# Patient Record
Sex: Male | Born: 1975 | Race: White | Hispanic: No | Marital: Married | Smoking: Never smoker
Health system: Southern US, Community
[De-identification: ages and names within clinical notes are randomized; demographics above are authoritative.]

## PROBLEM LIST (undated history)

## (undated) DIAGNOSIS — T7840XA Allergy, unspecified, initial encounter: Secondary | ICD-10-CM

## (undated) DIAGNOSIS — M1990 Unspecified osteoarthritis, unspecified site: Secondary | ICD-10-CM

## (undated) DIAGNOSIS — E7800 Pure hypercholesterolemia, unspecified: Secondary | ICD-10-CM

## (undated) HISTORY — DX: Unspecified osteoarthritis, unspecified site: M19.90

## (undated) HISTORY — PX: FRACTURE SURGERY: SHX138

## (undated) HISTORY — PX: ARTHROSCOPIC REPAIR ACL: SUR80

## (undated) HISTORY — PX: SHOULDER ARTHROSCOPY W/ LABRAL REPAIR: SHX2399

## (undated) HISTORY — DX: Allergy, unspecified, initial encounter: T78.40XA

## (undated) HISTORY — DX: Pure hypercholesterolemia, unspecified: E78.00

---

## 2016-07-12 DIAGNOSIS — R030 Elevated blood-pressure reading, without diagnosis of hypertension: Secondary | ICD-10-CM | POA: Diagnosis not present

## 2016-07-12 DIAGNOSIS — R109 Unspecified abdominal pain: Secondary | ICD-10-CM | POA: Diagnosis not present

## 2016-07-12 DIAGNOSIS — K861 Other chronic pancreatitis: Secondary | ICD-10-CM | POA: Diagnosis not present

## 2016-07-30 DIAGNOSIS — Z0000 Encounter for general adult medical examination without abnormal findings: Secondary | ICD-10-CM | POA: Diagnosis not present

## 2016-07-30 DIAGNOSIS — Z114 Encounter for screening for human immunodeficiency virus [HIV]: Secondary | ICD-10-CM | POA: Diagnosis not present

## 2016-07-30 DIAGNOSIS — Z1389 Encounter for screening for other disorder: Secondary | ICD-10-CM | POA: Diagnosis not present

## 2016-07-30 DIAGNOSIS — R079 Chest pain, unspecified: Secondary | ICD-10-CM | POA: Diagnosis not present

## 2017-11-22 ENCOUNTER — Ambulatory Visit: Payer: Self-pay | Admitting: Osteopathic Medicine

## 2017-12-13 ENCOUNTER — Encounter: Payer: Self-pay | Admitting: Osteopathic Medicine

## 2017-12-13 ENCOUNTER — Ambulatory Visit (INDEPENDENT_AMBULATORY_CARE_PROVIDER_SITE_OTHER): Payer: BLUE CROSS/BLUE SHIELD | Admitting: Osteopathic Medicine

## 2017-12-13 VITALS — BP 135/91 | HR 87 | Temp 98.1°F | Ht 73.0 in | Wt 191.9 lb

## 2017-12-13 DIAGNOSIS — Z0000 Encounter for general adult medical examination without abnormal findings: Secondary | ICD-10-CM | POA: Diagnosis not present

## 2017-12-13 DIAGNOSIS — M25561 Pain in right knee: Secondary | ICD-10-CM

## 2017-12-13 DIAGNOSIS — M545 Low back pain, unspecified: Secondary | ICD-10-CM

## 2017-12-13 DIAGNOSIS — K589 Irritable bowel syndrome without diarrhea: Secondary | ICD-10-CM | POA: Diagnosis not present

## 2017-12-13 DIAGNOSIS — Z23 Encounter for immunization: Secondary | ICD-10-CM | POA: Diagnosis not present

## 2017-12-13 DIAGNOSIS — G8929 Other chronic pain: Secondary | ICD-10-CM | POA: Insufficient documentation

## 2017-12-13 DIAGNOSIS — M25562 Pain in left knee: Secondary | ICD-10-CM

## 2017-12-13 NOTE — Patient Instructions (Addendum)
General Preventive Care  Most recent routine screening lipids/other labs: ordered today. Cholesterol and Diabetes screening usually recommended annually.   Everyone should have blood pressure checked once per year. Yours was borderline today - goal 130 top, 80 bottom.   Tobacco: don't! Alcohol: responsible moderation is ok for most adults - if you have concerns about your alcohol intake, please talk to me! Recreational/Illicit Drugs: don't!  Exercise: as tolerated to reduce risk of cardiovascular disease and diabetes. Strength training will also prevent osteoporosis.   Mental health: if need for mental health care (medicines, counseling, other), or concerns about moods, please let me know!   Sexual health: if need for STD testing, or if concerns with libido/pain problems, please let me know! If you need to discuss your birth control options, please let me know!  Vaccines  Flu vaccine: recommended for almost everyone, every fall (by Halloween! Flu is scary!)  Shingles vaccine: Shingrix recommended after age 94   Pneumonia vaccines: Prevnar and Pneumovax recommended after age 36  Tetanus booster: Tdap recommended every 10 years Cancer screenings   Colon cancer screening: recommended for everyone at age 68, but some folks need a colonoscopy sooner if risk factors   Prostate cancer screening: recommendations vary, optional PSA blood test for men around age 41 Infection screenings . HIV: recommended screening at least once age 62-65, more often if needed . Gonorrhea/Chlamydia: screening as needed . Hepatitis C: recommended for anyone born 35-1965 . TB: certain at-risk populations, or depending on work requirements and/or travel history Other . Bone Density Test: recommended for men at age 18, sooner depending on risk factors . Advanced Directive: Living Will and/or Healthcare Power of Attorney recommended for all adults, regardless of age or health!      For GI issues: Can try  Immodium OTC 30-60 minutes prior to large meals  See below for FODMAP - foods to try avoiding   2017 UpToDate Characteristics and sources of common FODMAPs  Word that corresponds to letter in acronym Compounds in this category Foods that contain these compounds  F Fermentable  O Oligosaccharides Fructans, galacto-oligosaccharides Wheat, barley, rye, onion, leek, white part of spring onion, garlic, shallots, artichokes, beetroot, fennel, peas, chicory, pistachio, cashews, legumes, lentils, and chickpeas  D Disaccharides Lactose Milk, custard, ice cream, and yogurt  M Monosaccharides "Free fructose" (fructose in excess of glucose) Apples, pears, mangoes, cherries, watermelon, asparagus, sugar snap peas, honey, high-fructose corn syrup  A And  P Polyols Sorbitol, mannitol, maltitol, and xylitol Apples, pears, apricots, cherries, nectarines, peaches, plums, watermelon, mushrooms, cauliflower, artificially sweetened chewing gum and confectionery  FODMAPs: fermentable oligosaccharides, disaccharides, monosaccharides, and polyols. Adapted by permission from [employer]: [employer]. Lonell Face, [employer]omer [employer], [place]. Short-chain carbohydrates and functional gastrointestinal disorders. Am J Gastroenterol 2013; 108:707. Copyright  2013. www.nature.com/ajg. Graphic 16109 Version 2.0    Please note: Preventive care issues were addressed today as part of your annual wellness physical, and this care should be covered under your insurance. However there were other medical issues which were also addressed today, and insurance may bill you separately for a "problem-based visit" for this care for GI and musculoskeletal problems. Any questions or concerns about charges which may appear on your billing statement should be directed to your insurance company or to [facility] billing department, and they will contact our office if there are further concerns.

## 2017-12-13 NOTE — Progress Notes (Signed)
HPI: Johnathan Oneal is a 42 y.o. male who  has no past medical history on file.  he presents to [facility] today, 12/13/17,  for chief complaint of: New to establish care Request routine yearly checkup Knee pain IBS symptoms  History of high cholesterol, previously on Lipitor, no medications now.  Never smoker, never recreational drugs, rare alcohol use.  Cisgender, heterosexual.  Last time tested for STI was 2017.  Declines STI screening now.  One partner within the past year.  They are using birth control pill for contraception.  Family history of breast cancer in mother.  No history of prostate cancer or colon cancer.  Knee pain bilateral, worse in L knee. Ongoing, previous following w/ ortho, few injections, arthroscopies, previously torn ACL. Knees still bother hi, R quadricep has been sore as well, no recent injury. He's working on low impact exercises.   Digestive concerns: Reports urgency to have a bowel movement after a large meal, chocolate and tomato and he has done a bit better since cutting these things out.  Occasional epigastric pain, no nausea, no significant GERD.  No bloody or dark stool, bowel movements limited to usually once per day.   Available records reviewed, [facility] visit 03/19/2015 for hyperlipidemia, bilateral knee arthritis, chronic back pain.  Lipitor 40 mg was prescribed.  He is knee injections, minimally helpful.  Has been seeing a chiropractor, taking glucosamine supplement.  Medications included Allegra 180 mg daily, Singulair 10 mg daily    Past medical, surgical, social and family history reviewed:  Patient Active Problem List   Diagnosis Date Noted  . Symptoms consistent with irritable bowel syndrome 12/13/2017  . Chronic pain of both knees 12/13/2017  . Chronic bilateral low back pain without sciatica 12/13/2017    Past Surgical History:  Procedure Laterality Date  . ARTHROSCOPIC REPAIR ACL     Bilateral Patella Area  . SHOULDER ARTHROSCOPY W/ LABRAL REPAIR      Social History   Tobacco Use  . Smoking status: Not on file  Substance Use Topics  . Alcohol use: Not on file    Family History  Problem Relation Age of Onset  . Breast cancer Mother   . High blood pressure Father   . Diabetes Father   . Heart attack Maternal Grandfather   . Canavan disease Paternal Grandmother   . Cancer Paternal Grandmother   . Diabetes Paternal Grandfather      Current medication list and allergy/intolerance information reviewed:    No current outpatient medications on file.   No current facility-administered medications for this visit.     Allergies  Allergen Reactions  . Penicillins     As per pt - had a reaction as a child. Does not recall what type of reaction to med.       Review of Systems:  Constitutional:  No  fever, no chills, No recent illness, No unintentional weight changes. No significant fatigue.   HEENT: No  headache, no vision change, no hearing change, No sore throat, No  sinus pressure  Cardiac: No  chest pain, No  pressure, No palpitations, No  Orthopnea  Respiratory:  No  shortness of breath. No  Cough  Gastrointestinal: No  abdominal pain, No  nausea, No  vomiting,  No  blood in stool, No  diarrhea, No  constipation   Musculoskeletal: No new myalgia/arthralgia  Skin: No  Rash, No other wounds/concerning lesions  Genitourinary: No  incontinence, No  abnormal genital  bleeding, No abnormal genital discharge  Hem/Onc: No  easy bruising/bleeding, No  abnormal lymph node  Endocrine: No cold intolerance,  No heat intolerance. No polyuria/polydipsia/polyphagia   Neurologic: No  weakness, No  dizziness, No  slurred speech/focal weakness/facial droop  Psychiatric: No  concerns with depression, No  concerns with anxiety, No sleep problems, No mood problems  Exam:  BP (!) 135/91 (BP Location: Left Arm, Patient Position: Sitting, Cuff Size: Normal)   Pulse  87   Temp 98.1 F (36.7 C) (Oral)   Ht 6\' 1"  (1.854 m)   Wt 191 lb 14.4 oz (87 kg)   BMI 25.32 kg/m   Constitutional: VS see above. General Appearance: alert, well-developed, well-nourished, NAD  Eyes: Normal lids and conjunctive, non-icteric sclera  Ears, Nose, Mouth, Throat: MMM, Normal external inspection ears/nares/mouth/lips/gums. TM normal bilaterally. Pharynx/tonsils no erythema, no exudate. Nasal mucosa normal.   Neck: No masses, trachea midline. No thyroid enlargement. No tenderness/mass appreciated. No lymphadenopathy  Respiratory: Normal respiratory effort. no wheeze, no rhonchi, no rales  Cardiovascular: S1/S2 normal, no murmur, no rub/gallop auscultated. RRR. No lower extremity edema. Pedal pulse II/IV bilaterally DP and PT. No carotid bruit or JVD. No abdominal aortic bruit.  Gastrointestinal: Nontender, no masses. No hepatomegaly, no splenomegaly. No hernia appreciated. Bowel sounds normal. Rectal exam deferred.   Musculoskeletal: Gait normal. No clubbing/cyanosis of digits. (+)crepitus L knee, ligmanets stable bilaterally ACL/PCL/MCL/LCL. Normal patellar motion    Neurological: Normal balance/coordination. No tremor. No cranial nerve deficit on limited exam. Motor and sensation intact and symmetric. Cerebellar reflexes intact.   Skin: warm, dry, intact. No rash/ulcer. No concerning nevi or subq nodules on limited exam.    Psychiatric: Normal judgment/insight. Normal mood and affect. Oriented x3.    No results found for this or any previous visit (from the past 72 hour(s)).  No results found.   ASSESSMENT/PLAN:   Annual physical exam  Symptoms consistent with irritable bowel syndrome - Plan: CBC, COMPLETE METABOLIC PANEL WITH GFR, Lipid panel, TSH, Lipase  Need for influenza vaccination - Plan: Flu Vaccine QUAD 6+ mos PF IM (Fluarix Quad PF)  Chronic pain of both knees - Plan: CBC, COMPLETE METABOLIC PANEL WITH GFR, Lipid panel, TSH, Lipase  Chronic  bilateral low back pain without sciatica - Plan: CBC, COMPLETE METABOLIC PANEL WITH GFR, Lipid panel, TSH, Lipase    Patient Instructions   General Preventive Care  Most recent routine screening lipids/other labs: ordered today. Cholesterol and Diabetes screening usually recommended annually.   Everyone should have blood pressure checked once per year. Yours was borderline today - goal 130 top, 80 bottom.   Tobacco: don't! Alcohol: responsible moderation is ok for most adults - if you have concerns about your alcohol intake, please talk to me! Recreational/Illicit Drugs: don't!  Exercise: as tolerated to reduce risk of cardiovascular disease and diabetes. Strength training will also prevent osteoporosis.   Mental health: if need for mental health care (medicines, counseling, other), or concerns about moods, please let me know!   Sexual health: if need for STD testing, or if concerns with libido/pain problems, please let me know! If you need to discuss your birth control options, please let me know!  Vaccines  Flu vaccine: recommended for almost everyone, every fall (by Halloween! Flu is scary!)  Shingles vaccine: Shingrix recommended after age 38   Pneumonia vaccines: Prevnar and Pneumovax recommended after age 78  Tetanus booster: Tdap recommended every 10 years Cancer screenings   Colon cancer screening: recommended for everyone  at age 81, but some folks need a colonoscopy sooner if risk factors   Prostate cancer screening: recommendations vary, optional PSA blood test for men around age 7 Infection screenings . HIV: recommended screening at least once age 25-65, more often if needed . Gonorrhea/Chlamydia: screening as needed . Hepatitis C: recommended for anyone born 44-1964 . TB: certain at-risk populations, or depending on work requirements and/or travel history Other . Bone Density Test: recommended for men at age 39, sooner depending on risk factors . Advanced  Directive: Living Will and/or Healthcare Power of Attorney recommended for all adults, regardless of age or health!      For GI issues: Can try Immodium OTC 30-60 minutes prior to large meals  See below for FODMAP - foods to try avoiding   2017 UpToDate Characteristics and sources of common FODMAPs  Word that corresponds to letter in acronym Compounds in this category Foods that contain these compounds  F Fermentable  O Oligosaccharides Fructans, galacto-oligosaccharides Wheat, barley, rye, onion, leek, white part of spring onion, garlic, shallots, artichokes, beetroot, fennel, peas, chicory, pistachio, cashews, legumes, lentils, and chickpeas  D Disaccharides Lactose Milk, custard, ice cream, and yogurt  M Monosaccharides "Free fructose" (fructose in excess of glucose) Apples, pears, mangoes, cherries, watermelon, asparagus, sugar snap peas, honey, high-fructose corn syrup  A And  P Polyols Sorbitol, mannitol, maltitol, and xylitol Apples, pears, apricots, cherries, nectarines, peaches, plums, watermelon, mushrooms, cauliflower, artificially sweetened chewing gum and confectionery  FODMAPs: fermentable oligosaccharides, disaccharides, monosaccharides, and polyols. Adapted by permission from [employer]: [employer]. Lonell Face, [employer], [place]. Short-chain carbohydrates and functional gastrointestinal disorders. Am J Gastroenterol 2013; 108:707. Copyright  2013. www.nature.com/ajg. Graphic 16109 Version 2.0             Visit summary with medication list and pertinent instructions was printed for patient to review. All questions at time of visit were answered - patient instructed to contact office with any additional concerns. ER/RTC precautions were reviewed with the patient.   Follow-up plan: Return in about 1 year (around 12/14/2018) for annual wellness check-up.   Please note: voice recognition software was used to produce this  document, and typos may escape review. Please contact Dr. Lyn Hollingshead for any needed clarifications.

## 2017-12-14 ENCOUNTER — Encounter: Payer: Self-pay | Admitting: Osteopathic Medicine

## 2017-12-14 DIAGNOSIS — E782 Mixed hyperlipidemia: Secondary | ICD-10-CM

## 2017-12-14 LAB — CBC
HCT: 42.7 % (ref 38.5–50.0)
Hemoglobin: 14.5 g/dL (ref 13.2–17.1)
MCH: 29.8 pg (ref 27.0–33.0)
MCHC: 34 g/dL (ref 32.0–36.0)
MCV: 87.7 fL (ref 80.0–100.0)
MPV: 12.3 fL (ref 7.5–12.5)
PLATELETS: 254 10*3/uL (ref 140–400)
RBC: 4.87 10*6/uL (ref 4.20–5.80)
RDW: 12.6 % (ref 11.0–15.0)
WBC: 7.9 10*3/uL (ref 3.8–10.8)

## 2017-12-14 LAB — COMPLETE METABOLIC PANEL WITH GFR
AG RATIO: 1.7 (calc) (ref 1.0–2.5)
ALT: 26 U/L (ref 9–46)
AST: 21 U/L (ref 10–40)
Albumin: 4.5 g/dL (ref 3.6–5.1)
Alkaline phosphatase (APISO): 90 U/L (ref 40–115)
BUN: 14 mg/dL (ref 7–25)
CO2: 27 mmol/L (ref 20–32)
Calcium: 9.4 mg/dL (ref 8.6–10.3)
Chloride: 105 mmol/L (ref 98–110)
Creat: 0.85 mg/dL (ref 0.60–1.35)
GFR, Est African American: 126 mL/min/{1.73_m2} (ref >=60)
GFR, Est Non African American: 109 mL/min/{1.73_m2} (ref >=60)
GLUCOSE: 83 mg/dL (ref 65–99)
Globulin: 2.6 g/dL (calc) (ref 1.9–3.7)
POTASSIUM: 4.2 mmol/L (ref 3.5–5.3)
Sodium: 140 mmol/L (ref 135–146)
Total Bilirubin: 0.4 mg/dL (ref 0.2–1.2)
Total Protein: 7.1 g/dL (ref 6.1–8.1)

## 2017-12-14 LAB — LIPID PANEL
Cholesterol: 250 mg/dL — ABNORMAL HIGH (ref <200)
HDL: 30 mg/dL — AB (ref >40)
LDL Cholesterol (Calc): 172 mg/dL (calc) — ABNORMAL HIGH
Non-HDL Cholesterol (Calc): 220 mg/dL (calc) — ABNORMAL HIGH (ref <130)
TRIGLYCERIDES: 268 mg/dL — AB (ref <150)
Total CHOL/HDL Ratio: 8.3 (calc) — ABNORMAL HIGH (ref <5.0)

## 2017-12-14 LAB — LIPASE: Lipase: 5 U/L — ABNORMAL LOW (ref 7–60)

## 2017-12-14 LAB — TSH: TSH: 0.93 mIU/L (ref 0.40–4.50)

## 2017-12-15 MED ORDER — ATORVASTATIN CALCIUM 20 MG PO TABS: 20.0000 mg | ORAL_TABLET | Freq: Every day | ORAL | 3 refills | Status: DC

## 2018-03-01 LAB — HEPATIC FUNCTION PANEL
AG Ratio: 1.7 (calc) (ref 1.0–2.5)
ALBUMIN MSPROF: 4.6 g/dL (ref 3.6–5.1)
ALT: 98 U/L — ABNORMAL HIGH (ref 9–46)
AST: 31 U/L (ref 10–40)
Alkaline phosphatase (APISO): 96 U/L (ref 40–115)
BILIRUBIN DIRECT: 0.1 mg/dL (ref 0.0–0.2)
BILIRUBIN TOTAL: 0.6 mg/dL (ref 0.2–1.2)
GLOBULIN: 2.7 g/dL (ref 1.9–3.7)
Indirect Bilirubin: 0.5 mg/dL (calc) (ref 0.2–1.2)
Total Protein: 7.3 g/dL (ref 6.1–8.1)

## 2018-03-01 LAB — LIPID PANEL
CHOLESTEROL: 172 mg/dL (ref <200)
HDL: 35 mg/dL — AB (ref >40)
LDL Cholesterol (Calc): 114 mg/dL (calc) — ABNORMAL HIGH
NON-HDL CHOLESTEROL (CALC): 137 mg/dL — AB (ref <130)
Total CHOL/HDL Ratio: 4.9 (calc) (ref <5.0)
Triglycerides: 115 mg/dL (ref <150)

## 2018-03-07 ENCOUNTER — Other Ambulatory Visit: Payer: Self-pay | Admitting: Osteopathic Medicine

## 2018-03-07 DIAGNOSIS — R748 Abnormal levels of other serum enzymes: Secondary | ICD-10-CM

## 2018-03-18 LAB — HEPATIC FUNCTION PANEL
AG Ratio: 1.6 (calc) (ref 1.0–2.5)
ALBUMIN MSPROF: 4.4 g/dL (ref 3.6–5.1)
ALT: 37 U/L (ref 9–46)
AST: 20 U/L (ref 10–40)
Alkaline phosphatase (APISO): 86 U/L (ref 40–115)
Bilirubin, Direct: 0.1 mg/dL (ref 0.0–0.2)
Globulin: 2.8 g/dL (calc) (ref 1.9–3.7)
Indirect Bilirubin: 0.4 mg/dL (calc) (ref 0.2–1.2)
Total Bilirubin: 0.5 mg/dL (ref 0.2–1.2)
Total Protein: 7.2 g/dL (ref 6.1–8.1)

## 2018-03-20 ENCOUNTER — Encounter: Payer: Self-pay | Admitting: Osteopathic Medicine

## 2018-03-20 DIAGNOSIS — E782 Mixed hyperlipidemia: Secondary | ICD-10-CM

## 2018-03-20 MED ORDER — PRAVASTATIN SODIUM 20 MG PO TABS: 20.0000 mg | ORAL_TABLET | Freq: Every day | ORAL | 3 refills | Status: DC

## 2018-05-19 ENCOUNTER — Other Ambulatory Visit: Payer: Self-pay | Admitting: Osteopathic Medicine

## 2018-05-19 LAB — COMPLETE METABOLIC PANEL WITH GFR
AG Ratio: 1.6 (calc) (ref 1.0–2.5)
ALT: 28 U/L (ref 9–46)
AST: 18 U/L (ref 10–40)
Albumin: 4.5 g/dL (ref 3.6–5.1)
Alkaline phosphatase (APISO): 97 U/L (ref 36–130)
BUN: 16 mg/dL (ref 7–25)
CO2: 29 mmol/L (ref 20–32)
Calcium: 9.7 mg/dL (ref 8.6–10.3)
Chloride: 103 mmol/L (ref 98–110)
Creat: 0.94 mg/dL (ref 0.60–1.35)
GFR, Est African American: 116 mL/min/{1.73_m2} (ref >=60)
GFR, Est Non African American: 100 mL/min/{1.73_m2} (ref >=60)
Globulin: 2.8 g/dL (calc) (ref 1.9–3.7)
Glucose, Bld: 85 mg/dL (ref 65–99)
Potassium: 4.4 mmol/L (ref 3.5–5.3)
Sodium: 140 mmol/L (ref 135–146)
Total Bilirubin: 0.5 mg/dL (ref 0.2–1.2)
Total Protein: 7.3 g/dL (ref 6.1–8.1)

## 2018-05-19 LAB — LIPID PANEL W/REFLEX DIRECT LDL
Cholesterol: 213 mg/dL — ABNORMAL HIGH (ref <200)
HDL: 33 mg/dL — ABNORMAL LOW (ref >=40)
LDL CHOLESTEROL (CALC): 151 mg/dL — AB
Non-HDL Cholesterol (Calc): 180 mg/dL (calc) — ABNORMAL HIGH (ref <130)
Total CHOL/HDL Ratio: 6.5 (calc) — ABNORMAL HIGH (ref <5.0)
Triglycerides: 156 mg/dL — ABNORMAL HIGH (ref <150)

## 2018-06-01 ENCOUNTER — Encounter: Payer: Self-pay | Admitting: Osteopathic Medicine

## 2018-06-01 MED ORDER — PRAVASTATIN SODIUM 40 MG PO TABS: 40.0000 mg | ORAL_TABLET | Freq: Every day | ORAL | 3 refills | Status: DC

## 2018-12-14 ENCOUNTER — Other Ambulatory Visit: Payer: Self-pay

## 2018-12-14 ENCOUNTER — Encounter: Payer: Self-pay | Admitting: Osteopathic Medicine

## 2018-12-14 ENCOUNTER — Ambulatory Visit (INDEPENDENT_AMBULATORY_CARE_PROVIDER_SITE_OTHER): Payer: BC Managed Care – PPO | Admitting: Osteopathic Medicine

## 2018-12-14 VITALS — BP 138/83 | HR 67 | Temp 98.2°F | Wt 189.0 lb

## 2018-12-14 DIAGNOSIS — Z0000 Encounter for general adult medical examination without abnormal findings: Secondary | ICD-10-CM

## 2018-12-14 DIAGNOSIS — E782 Mixed hyperlipidemia: Secondary | ICD-10-CM

## 2018-12-14 NOTE — Progress Notes (Signed)
HPI: Johnathan Oneal is a 43 y.o. male who  has a past medical history of High cholesterol.  [facility], 12/14/18,  for chief complaint of: Annual Physical     Patient here for annual physical / wellness exam.  See preventive care reviewed as below.  Recent labs reviewed in detail with the patient. Due for follow-up lipids   Additional concerns today include: None other than occasional knee pain which responds well to OTC tx     Past medical, surgical, social and family history reviewed:  Patient Active Problem List   Diagnosis Date Noted  . Symptoms consistent with irritable bowel syndrome 12/13/2017  . Chronic pain of both knees 12/13/2017  . Chronic bilateral low back pain without sciatica 12/13/2017    Past Surgical History:  Procedure Laterality Date  . ARTHROSCOPIC REPAIR ACL     Bilateral Patella Area  . SHOULDER ARTHROSCOPY W/ LABRAL REPAIR      Social History   Tobacco Use  . Smoking status: Never Smoker  . Smokeless tobacco: Never Used  Substance Use Topics  . Alcohol use: Not Currently    Family History  Problem Relation Age of Onset  . Breast cancer Mother   . High blood pressure Father   . Diabetes Father   . Heart attack Maternal Grandfather   . Canavan disease Paternal Grandmother   . Cancer Paternal Grandmother   . Diabetes Paternal Grandfather      Current medication list and allergy/intolerance information reviewed:    Current Outpatient Medications  Medication Sig Dispense Refill  . pravastatin (PRAVACHOL) 40 MG tablet Take 1 tablet (40 mg total) by mouth daily. 90 tablet 3   No current facility-administered medications for this visit.     Allergies  Allergen Reactions  . Penicillins     As per pt - had a reaction as a child. Does not recall what type of reaction to med.       Review of Systems:  Constitutional:  No  fever, no chills, No recent illness  HEENT: No   headache, no vision change  Cardiac: No  chest pain, No  pressure  Respiratory:  No  shortness of breath. No  Cough  Gastrointestinal: No  abdominal pain, No  nausea, No  vomiting,  No  blood in stool, No  diarrhea, No  constipation   Musculoskeletal: No new myalgia/arthralgia  Skin: No  Rash, No other wounds/concerning lesions  Genitourinary: No  incontinence, No  abnormal genital bleeding, No abnormal genital discharge  Hem/Onc: No  easy bruising/bleeding  Endocrine: No cold intolerance,  No heat intolerance.   Neurologic: No  weakness, No  dizziness  Psychiatric: No  concerns with depression, No  concerns with anxiety, No sleep problems, No mood problems  Exam:  BP 138/83 (BP Location: Right Arm, Patient Position: Sitting, Cuff Size: Normal)   Pulse 67   Temp 98.2 F (36.8 C) (Oral)   Wt 189 lb 0.6 oz (85.7 kg)   BMI 24.94 kg/m   Constitutional: VS see above. General Appearance: alert, well-developed, well-nourished, NAD  Eyes: Normal lids and conjunctive, non-icteric sclera  Ears, Nose, Mouth, Throat:  TM normal bilaterally.   Neck: No masses, trachea midline. No thyroid enlargement. No tenderness/mass appreciated. No lymphadenopathy  Respiratory: Normal respiratory effort. no wheeze, no rhonchi, no rales  Cardiovascular: S1/S2 normal, no murmur, no rub/gallop auscultated. RRR. No lower extremity edema.   Gastrointestinal: Nontender, no masses. No hepatomegaly, no  splenomegaly. No hernia appreciated. Bowel sounds normal. Rectal exam deferred.   Musculoskeletal: Gait normal. No clubbing/cyanosis of digits.   Neurological: Normal balance/coordination. No tremor. No cranial nerve deficit on limited exam. Motor and sensation intact and symmetric. Cerebellar reflexes intact.   Skin: warm, dry, intact. No rash/ulcer. No concerning nevi or subq nodules on limited exam.    Psychiatric: Normal judgment/insight. Normal mood and affect. Oriented x3.    No results found  for this or any previous visit (from the past 72 hour(s)).  No results found.        ASSESSMENT/PLAN: The primary encounter diagnosis was Annual physical exam. A diagnosis of Mixed hyperlipidemia was also pertinent to this visit.  Patient will come off of cholesterol medications for 4 to 6 weeks and will follow up in office for nurse visit and will get labs done that day.  Looking okay/ASCVD risk okay, will leave off of antihyperlipidemics.   The 10-year ASCVD risk score Denman George DC Montez Hageman., et al., 2013) is: 2.9%   Values used to calculate the score:     Age: 42 years     Sex: Male     Is Non-Hispanic African American: No     Diabetic: No     Tobacco smoker: No     Systolic Blood Pressure: 138 mmHg     Is BP treated: No     HDL Cholesterol: 33 mg/dL     Total Cholesterol: 213 mg/dL   Orders Placed This Encounter  Procedures  . CBC  . COMPLETE METABOLIC PANEL WITH GFR  . Lipid panel    No orders of the defined types were placed in this encounter.   Patient Instructions  General Preventive Care  Most recent routine screening lipids/other labs: ordered today!   Everyone should have blood pressure checked once per year. Goal 130/80 or less.   Tobacco: don't!   Alcohol: responsible moderation is ok for most adults - if you have concerns about your alcohol intake, please talk to me!   Exercise: as tolerated to reduce risk of cardiovascular disease and diabetes. Strength training will also prevent osteoporosis.   Mental health: if need for mental health care (medicines, counseling, other), or concerns about moods, please let me know!   Sexual health: if need for STD testing, or if concerns with libido/pain problems, please let me know!   Advanced Directive: Living Will and/or Healthcare Power of Attorney recommended for all adults, regardless of age or health.  Vaccines  Flu vaccine: recommended for almost everyone, every fall.   Shingles vaccine: Shingrix recommended  after age 84.   Pneumonia vaccines: Prevnar and Pneumovax recommended after age 76, or sooner if certain medical conditions/smoker  Tetanus booster: Tdap recommended every 10 years. Due 2022 Cancer screenings   Colon cancer screening: recommended for everyone at age 67-75, sooner if needed if stor  Prostate cancer screening: PSA blood test around age 58, sooner if needed if family history of prostate cancer   Lung cancer screening: not needed for non-smokers  Infection screenings . HIV: recommended screening at least once age 23-65, more often as needed. . Gonorrhea/Chlamydia: screening as needed. . Hepatitis C: recommended for anyone born [birth year]-1965 . TB: certain at-risk populations, or depending on work requirements and/or travel history Other . Bone Density Test: recommended for men at age 74, sooner depending on risk factors . Abdominal Aortic Aneurysm: screening with ultrasound recommended once for men age 67-75 who have ever smoked  Visit summary with medication list and pertinent instructions was printed for patient to review. All questions at time of visit were answered - patient instructed to contact office with any additional concerns or updates. ER/RTC precautions were reviewed with the patient.   Please note: voice recognition software was used to produce this document, and typos may escape review. Please contact Dr. Sheppard Coil for any needed clarifications.     Follow-up plan: Return in about 1 year (around 12/14/2019) for Nurse visit for BP check, and lab visit, in 4-6 weeks AM fasting. Marland Kitchen

## 2018-12-14 NOTE — Patient Instructions (Addendum)
General Preventive Care  Most recent routine screening lipids/other labs: ordered today!   Everyone should have blood pressure checked once per year. Goal 130/80 or less.   Tobacco: don't!   Alcohol: responsible moderation is ok for most adults - if you have concerns about your alcohol intake, please talk to me!   Exercise: as tolerated to reduce risk of cardiovascular disease and diabetes. Strength training will also prevent osteoporosis.   Mental health: if need for mental health care (medicines, counseling, other), or concerns about moods, please let me know!   Sexual health: if need for STD testing, or if concerns with libido/pain problems, please let me know!   Advanced Directive: Living Will and/or Healthcare Power of Attorney recommended for all adults, regardless of age or health.  Vaccines  Flu vaccine: recommended for almost everyone, every fall.   Shingles vaccine: Shingrix recommended after age 84.   Pneumonia vaccines: Prevnar and Pneumovax recommended after age 67, or sooner if certain medical conditions/smoker  Tetanus booster: Tdap recommended every 10 years. Due 2022 Cancer screenings   Colon cancer screening: recommended for everyone at age 35-75, sooner if needed if stor  Prostate cancer screening: PSA blood test around age 60, sooner if needed if family history of prostate cancer   Lung cancer screening: not needed for non-smokers  Infection screenings . HIV: recommended screening at least once age 51-65, more often as needed. . Gonorrhea/Chlamydia: screening as needed. . Hepatitis C: recommended for anyone born 65-1965 . TB: certain at-risk populations, or depending on work requirements and/or travel history Other . Bone Density Test: recommended for men at age 86, sooner depending on risk factors . Abdominal Aortic Aneurysm: screening with ultrasound recommended once for men age 66-75 who have ever smoked

## 2018-12-14 NOTE — Progress Notes (Signed)
HPI: Johnathan Oneal is a 43 y.o. male who  has a past medical history of High cholesterol.  he presents to [facility] today, 12/14/18,  for chief complaint of: annual visit   Patient is doing well today. BP elevated to 138/83. Does not take BP at home. Already received flu shot this season. No other complaints.    Past medical, surgical, social and family history reviewed:  Patient Active Problem List   Diagnosis Date Noted  . Symptoms consistent with irritable bowel syndrome 12/13/2017  . Chronic pain of both knees 12/13/2017  . Chronic bilateral low back pain without sciatica 12/13/2017    Past Surgical History:  Procedure Laterality Date  . ARTHROSCOPIC REPAIR ACL     Bilateral Patella Area  . SHOULDER ARTHROSCOPY W/ LABRAL REPAIR      Social History   Tobacco Use  . Smoking status: Never Smoker  . Smokeless tobacco: Never Used  Substance Use Topics  . Alcohol use: Not Currently    Family History  Problem Relation Age of Onset  . Breast cancer Mother   . High blood pressure Father   . Diabetes Father   . Heart attack Maternal Grandfather   . Canavan disease Paternal Grandmother   . Cancer Paternal Grandmother   . Diabetes Paternal Grandfather      Current medication list and allergy/intolerance information reviewed:    Current Outpatient Medications  Medication Sig Dispense Refill  . pravastatin (PRAVACHOL) 40 MG tablet Take 1 tablet (40 mg total) by mouth daily. 90 tablet 3   No current facility-administered medications for this visit.     Allergies  Allergen Reactions  . Penicillins     As per pt - had a reaction as a child. Does not recall what type of reaction to med.       Review of Systems:  Constitutional:   No recent illness,. No significant fatigue.   HEENT: No  headache  Cardiac: No  chest pain  Respiratory:  No  shortness of breath. No  Cough  Gastrointestinal: No  abdominal pain, No   nausea  Musculoskeletal: No new myalgia/arthralgia   Exam:  BP 138/83 (BP Location: Right Arm, Patient Position: Sitting, Cuff Size: Normal)   Pulse 67   Temp 98.2 F (36.8 C) (Oral)   Wt 189 lb 0.6 oz (85.7 kg)   BMI 24.94 kg/m   Constitutional: VS see above. General Appearance: alert, well-developed, well-nourished, NAD  Eyes: Normal lids and conjunctive, non-icteric sclera  Ears: . TM normal bilaterally.   Neck: No masses, trachea midline. No thyroid enlargement. No tenderness/mass appreciated. No lymphadenopathy  Respiratory: Normal respiratory effort. no wheeze, no rhonchi, no rales  Cardiovascular: S1/S2 normal, no murmur, no rub/gallop auscultated. RRR. No lower extremity edema.  Gastrointestinal: Nontender, no masses. No hepatomegaly, no splenomegaly. No hernia appreciated. Bowel sounds normal. Rectal exam deferred.   Musculoskeletal: Gait normal. No clubbing/cyanosis of digits.   Neurological: Normal balance/coordination. No tremor. No cranial nerve deficit on limited exam.   Skin: warm, dry, intact. No rash/ulcer. No concerning nevi or subq nodules on limited exam.    Psychiatric: Normal judgment/insight. Normal mood and affect. Oriented x3.    No results found for this or any previous visit (from the past 72 hour(s)).  No results found.   ASSESSMENT/PLAN: The primary encounter diagnosis was Annual physical exam. A diagnosis of Mixed hyperlipidemia was also pertinent to this visit.   BP - Obtain BP cuff and monitor BP  regularly at home.   Hyperlipidemia - Trial 1 month off of pravastatin. Follow up in 4-6 weeks with fasting labs and BP check.    Orders Placed This Encounter  Procedures  . CBC  . COMPLETE METABOLIC PANEL WITH GFR  . Lipid panel    No orders of the defined types were placed in this encounter.   Patient Instructions  General Preventive Care  Most recent routine screening lipids/other labs: ordered today!   Everyone should have  blood pressure checked once per year. Goal 130/80 or less.   Tobacco: don't!   Alcohol: responsible moderation is ok for most adults - if you have concerns about your alcohol intake, please talk to me!   Exercise: as tolerated to reduce risk of cardiovascular disease and diabetes. Strength training will also prevent osteoporosis.   Mental health: if need for mental health care (medicines, counseling, other), or concerns about moods, please let me know!   Sexual health: if need for STD testing, or if concerns with libido/pain problems, please let me know!   Advanced Directive: Living Will and/or Healthcare Power of Attorney recommended for all adults, regardless of age or health.  Vaccines  Flu vaccine: recommended for almost everyone, every fall.   Shingles vaccine: Shingrix recommended after age 69.   Pneumonia vaccines: Prevnar and Pneumovax recommended after age 71, or sooner if certain medical conditions/smoker  Tetanus booster: Tdap recommended every 10 years. Due 2022 Cancer screenings   Colon cancer screening: recommended for everyone at age 76-75, sooner if needed if stor  Prostate cancer screening: PSA blood test around age 44, sooner if needed if family history of prostate cancer   Lung cancer screening: not needed for non-smokers  Infection screenings . HIV: recommended screening at least once age 67-65, more often as needed. . Gonorrhea/Chlamydia: screening as needed. . Hepatitis C: recommended for anyone born 47-1964 . TB: certain at-risk populations, or depending on work requirements and/or travel history Other . Bone Density Test: recommended for men at age 22, sooner depending on risk factors . Abdominal Aortic Aneurysm: screening with ultrasound recommended once for men age 25-75 who have ever smoked         Visit summary with medication list and pertinent instructions was printed for patient to review. All questions at time of visit were answered -  patient instructed to contact office with any additional concerns or updates. ER/RTC precautions were reviewed with the patient.    Please note: voice recognition software was used to produce this document, and typos may escape review. Please contact Dr. Sheppard Coil for any needed clarifications.     Follow-up plan: Return in about 1 year (around 12/14/2019) for [place] (call week prior to visit for lab orders).

## 2019-01-11 ENCOUNTER — Ambulatory Visit (INDEPENDENT_AMBULATORY_CARE_PROVIDER_SITE_OTHER): Payer: BC Managed Care – PPO | Admitting: Osteopathic Medicine

## 2019-01-11 ENCOUNTER — Other Ambulatory Visit: Payer: Self-pay

## 2019-01-11 VITALS — BP 119/82 | HR 64 | Temp 98.4°F | Ht 72.0 in | Wt 189.0 lb

## 2019-01-11 DIAGNOSIS — R030 Elevated blood-pressure reading, without diagnosis of hypertension: Secondary | ICD-10-CM

## 2019-01-11 NOTE — Progress Notes (Signed)
Patient in clinic for a BP check. Patient is not taking any medication at this time. Patient reports it was slightly elevated at the last visit. I advised him that I would let him know if a follow up was needed. He does not check his BP at home.

## 2019-01-11 NOTE — Progress Notes (Signed)
BP Readings from Last 3 Encounters:  01/11/19 119/82  12/14/18 138/83  12/13/17 (!) 135/91   Wt Readings from Last 3 Encounters:  01/11/19 189 lb (85.7 kg)  12/14/18 189 lb 0.6 oz (85.7 kg)  12/13/17 191 lb 14.4 oz (87 kg)     BP looks good, no adjustments needed!  OK to follow up when due for next annual 11/2019

## 2019-01-12 LAB — COMPLETE METABOLIC PANEL WITH GFR
AG Ratio: 1.6 (calc) (ref 1.0–2.5)
ALT: 20 U/L (ref 9–46)
AST: 15 U/L (ref 10–40)
Albumin: 4.5 g/dL (ref 3.6–5.1)
Alkaline phosphatase (APISO): 88 U/L (ref 36–130)
BUN: 19 mg/dL (ref 7–25)
CO2: 27 mmol/L (ref 20–32)
Calcium: 9.4 mg/dL (ref 8.6–10.3)
Chloride: 105 mmol/L (ref 98–110)
Creat: 1.02 mg/dL (ref 0.60–1.35)
GFR, Est African American: 105 mL/min/{1.73_m2} (ref >=60)
GFR, Est Non African American: 91 mL/min/{1.73_m2} (ref >=60)
Globulin: 2.8 g/dL (calc) (ref 1.9–3.7)
Glucose, Bld: 93 mg/dL (ref 65–99)
Potassium: 4.3 mmol/L (ref 3.5–5.3)
Sodium: 141 mmol/L (ref 135–146)
Total Bilirubin: 0.5 mg/dL (ref 0.2–1.2)
Total Protein: 7.3 g/dL (ref 6.1–8.1)

## 2019-01-12 LAB — LIPID PANEL
Cholesterol: 262 mg/dL — ABNORMAL HIGH (ref <200)
HDL: 34 mg/dL — ABNORMAL LOW (ref >=40)
LDL Cholesterol (Calc): 196 mg/dL (calc) — ABNORMAL HIGH
Non-HDL Cholesterol (Calc): 228 mg/dL (calc) — ABNORMAL HIGH (ref <130)
Total CHOL/HDL Ratio: 7.7 (calc) — ABNORMAL HIGH (ref <5.0)
Triglycerides: 156 mg/dL — ABNORMAL HIGH (ref <150)

## 2019-01-12 LAB — CBC
HCT: 44.2 % (ref 38.5–50.0)
Hemoglobin: 14.9 g/dL (ref 13.2–17.1)
MCH: 30.5 pg (ref 27.0–33.0)
MCHC: 33.7 g/dL (ref 32.0–36.0)
MCV: 90.4 fL (ref 80.0–100.0)
MPV: 12.1 fL (ref 7.5–12.5)
Platelets: 254 10*3/uL (ref 140–400)
RBC: 4.89 10*6/uL (ref 4.20–5.80)
RDW: 12.7 % (ref 11.0–15.0)
WBC: 6.3 10*3/uL (ref 3.8–10.8)

## 2019-08-29 ENCOUNTER — Encounter: Payer: Self-pay | Admitting: Osteopathic Medicine

## 2019-08-30 ENCOUNTER — Other Ambulatory Visit: Payer: Self-pay

## 2019-08-30 MED ORDER — PRAVASTATIN SODIUM 40 MG PO TABS: 40.0000 mg | ORAL_TABLET | Freq: Every day | ORAL | 1 refills | Status: DC

## 2020-04-01 ENCOUNTER — Other Ambulatory Visit: Payer: Self-pay | Admitting: Osteopathic Medicine

## 2020-04-02 ENCOUNTER — Telehealth: Payer: Self-pay | Admitting: Osteopathic Medicine

## 2020-04-02 ENCOUNTER — Ambulatory Visit (INDEPENDENT_AMBULATORY_CARE_PROVIDER_SITE_OTHER): Payer: BC Managed Care – PPO

## 2020-04-02 ENCOUNTER — Other Ambulatory Visit: Payer: Self-pay

## 2020-04-02 ENCOUNTER — Ambulatory Visit (INDEPENDENT_AMBULATORY_CARE_PROVIDER_SITE_OTHER): Payer: BC Managed Care – PPO | Admitting: Osteopathic Medicine

## 2020-04-02 ENCOUNTER — Encounter: Payer: Self-pay | Admitting: Osteopathic Medicine

## 2020-04-02 VITALS — BP 145/96 | HR 67 | Temp 98.1°F | Wt 202.0 lb

## 2020-04-02 DIAGNOSIS — R1902 Left upper quadrant abdominal swelling, mass and lump: Secondary | ICD-10-CM

## 2020-04-02 DIAGNOSIS — M5412 Radiculopathy, cervical region: Secondary | ICD-10-CM | POA: Diagnosis not present

## 2020-04-02 DIAGNOSIS — M25512 Pain in left shoulder: Secondary | ICD-10-CM

## 2020-04-02 DIAGNOSIS — G8929 Other chronic pain: Secondary | ICD-10-CM

## 2020-04-02 DIAGNOSIS — R0683 Snoring: Secondary | ICD-10-CM

## 2020-04-02 DIAGNOSIS — M542 Cervicalgia: Secondary | ICD-10-CM

## 2020-04-02 MED ORDER — MELOXICAM 15 MG PO TABS: 15.0000 mg | ORAL_TABLET | Freq: Every day | ORAL | 0 refills | Status: DC

## 2020-04-02 NOTE — Telephone Encounter (Signed)
Please call [employer]: can we set up time for me to speak to someone about PA on Monday over lunch? I can be by the pone in my office - [phone]. If not, I can try calling on Monday

## 2020-04-02 NOTE — Progress Notes (Addendum)
HPI: Johnathan Oneal is a 45 y.o. male who  has a past medical history of High cholesterol.  he presents to [facility] today, 04/02/20,  for chief complaint of:   Snoring   Previous sleep study did not show significant sleep apnea, requests referral to ENT specialist  Shoulder pain   Location: L shoulder/neck area radiating down into the arm/hand  Quality: Soreness at the neck/shoulder, decreased grip strength on the left, occasional sharp shoulder pain  Severity:  Duration: 2-3 mos   Timing: worse w/ sleeping on his L side, reaching up especially holding book while reading   Context: Previous labral repair on that shoulder, he is uncertain if any rotator cuff pathology at that point  Assoc signs/symptoms: no numbness/tingling, no shooting pain   EXAM: (+)pain w spurling's maneuver on L, no shotting pain or numbness, normal symmetrical grip strength, notmal ROM L shoulder, neg apprehension, crossover, apley scratch tests   Abdominal mass  Location: LUQ  Quality: occasionally sharp pain but mostly nonpainful "lump"  Duration: noted initially few weeks ago  Context: no injury or prior abdominal surgery  Assoc signs/symptoms: no bowel habit change, no N/V, no fever  EXAM: BS WNL x4, normal percussion, on palpation LUQ there is a soft abdominal mass, not really c/w splenomegaly, nontender        ASSESSMENT/PLAN: The primary encounter diagnosis was Neck pain. Diagnoses of Chronic left shoulder pain, Cervical radiculopathy, Snoring, and Abdominal mass, left upper quadrant were also pertinent to this visit.  1. Neck pain 2. Chronic left shoulder pain 3. Cervical radiculopathy XR today NSAID  PT referral Sports med referral if not better Concern for early radiculopathy  4. Snoring ENT referral sent to discuss surgical options vs need new sleep study  5. Abdominal mass, left upper quadrant Ddx includes unusual intestinal or  splenic mass, less likely but possible pancreatic mass, possible diverticular issue but this seems unlikely in absence of GI symptoms. CT seems best option for evaluation - XR won't be specific, [place] may show abnormality which would need more detail anyway, if [place] normal, would still need imaging to further characterize mass given lower sensitivity of [place] for abdominal masses. This is in keeping with ACR Appropriateness Criteria 2019 for palpable abdominal masses in which neoplasm is suspected.    Orders Placed This Encounter  Procedures   CT Abdomen Pelvis W Contrast   DG Shoulder Left   DG CERVICAL SPINE 2 VIEW   Ambulatory referral to ENT   Ambulatory referral to Physical Therapy     Meds ordered this encounter  Medications   meloxicam (MOBIC) 15 MG tablet    Sig: Take 1 tablet (15 mg total) by mouth daily.    Dispense:  30 tablet    Refill:  0    There are no Patient Instructions on file for this visit.    Follow-up plan: Return for RECHECK PENDING RESULTS / IF WORSE OR CHANGE, VISIT WITH SPORTS MEDICINE FOR ORTHOPEDIC ISSUE.                                                 ################################################# ################################################# ################################################# #################################################    Current Meds  Medication Sig   fexofenadine (ALLEGRA) 180 MG tablet Take by mouth.   meloxicam (MOBIC) 15 MG tablet Take 1 tablet (15 mg total) by mouth  daily.   pravastatin (PRAVACHOL) 40 MG tablet TAKE 1 TABLET(40 MG) BY MOUTH DAILY    Allergies  Allergen Reactions   Penicillins     As per pt - had a reaction as a child. Does not recall what type of reaction to med.        Review of Systems: Pertinent (+) and (-) ROS in HPI as above   Exam:  BP (!) 145/96 (BP Location: Left Arm, Patient Position: Sitting, Cuff Size: Normal)    Pulse 67     Temp 98.1 F (36.7 C) (Oral)    Wt 202 lb (91.6 kg)    BMI 27.40 kg/m   Constitutional: VS see above. General Appearance: alert, well-developed, well-nourished, NAD  Neck: No masses, trachea midline.   Respiratory: Normal respiratory effort. no wheeze, no rhonchi, no rales  Cardiovascular: S1/S2 normal, no murmur, no rub/gallop auscultated. RRR.   Musculoskeletal: Gait normal.see above re: shoulder/neck   Abdominal:ssee above  Neurological: Normal balance/coordination. No tremor.  Skin: warm, dry, intact.   Psychiatric: Normal judgment/insight. Normal mood and affect. Oriented x3.       Visit summary with medication list and pertinent instructions was printed for patient to review, patient was advised to alert [place] if any updates are needed. All questions at time of visit were answered - patient instructed to contact office with any additional concerns. ER/RTC precautions were reviewed with the patient and understanding verbalized.       Please note: voice recognition software was used to produce this document, and typos may escape review. Please contact Dr. Lyn Hollingshead for any needed clarifications.    Follow up plan: Return for RECHECK PENDING RESULTS / IF WORSE OR CHANGE, VISIT WITH SPORTS MEDICINE FOR ORTHOPEDIC ISSUE.

## 2020-04-03 NOTE — Addendum Note (Signed)
Addended by: Deirdre Pippins on: 04/03/2020 04:45 PM   Modules accepted: Orders

## 2020-04-08 NOTE — Telephone Encounter (Signed)
Excellent. Thanks!

## 2020-04-08 NOTE — Telephone Encounter (Signed)
Called [employer] to set up peer to peer. Case is actually closed because clinic answers didn't meet criteria, HOWEVER, this pt's particular ins is part of some type of learning program it has been approved anyway.  Imaging notified.

## 2020-04-09 ENCOUNTER — Other Ambulatory Visit: Payer: Self-pay

## 2020-04-09 ENCOUNTER — Ambulatory Visit (INDEPENDENT_AMBULATORY_CARE_PROVIDER_SITE_OTHER): Payer: BC Managed Care – PPO

## 2020-04-09 DIAGNOSIS — K828 Other specified diseases of gallbladder: Secondary | ICD-10-CM | POA: Diagnosis not present

## 2020-04-09 DIAGNOSIS — R1902 Left upper quadrant abdominal swelling, mass and lump: Secondary | ICD-10-CM

## 2020-04-09 DIAGNOSIS — R1904 Left lower quadrant abdominal swelling, mass and lump: Secondary | ICD-10-CM | POA: Diagnosis not present

## 2020-04-09 MED ORDER — IOHEXOL 300 MG/ML  SOLN
100.0000 mL | Freq: Once | INTRAMUSCULAR | Status: AC | PRN
  Administered 2020-04-09: 100 mL via INTRAVENOUS

## 2020-04-15 ENCOUNTER — Ambulatory Visit: Payer: BC Managed Care – PPO | Admitting: Physical Therapy

## 2020-04-16 ENCOUNTER — Other Ambulatory Visit: Payer: Self-pay

## 2020-04-16 ENCOUNTER — Encounter: Payer: Self-pay | Admitting: Physical Therapy

## 2020-04-16 ENCOUNTER — Ambulatory Visit: Payer: BC Managed Care – PPO | Attending: Osteopathic Medicine | Admitting: Physical Therapy

## 2020-04-16 DIAGNOSIS — G8929 Other chronic pain: Secondary | ICD-10-CM | POA: Diagnosis not present

## 2020-04-16 DIAGNOSIS — M62838 Other muscle spasm: Secondary | ICD-10-CM | POA: Diagnosis not present

## 2020-04-16 DIAGNOSIS — R293 Abnormal posture: Secondary | ICD-10-CM | POA: Diagnosis not present

## 2020-04-16 DIAGNOSIS — M542 Cervicalgia: Secondary | ICD-10-CM | POA: Diagnosis not present

## 2020-04-16 DIAGNOSIS — M25512 Pain in left shoulder: Secondary | ICD-10-CM | POA: Insufficient documentation

## 2020-04-16 NOTE — Therapy (Signed)
[facility] [address]  [address] [place], [place], [address] Phone: [phone]   Fax:  [phone]  Physical Therapy Evaluation  Patient Details  Name: Johnathan Oneal MRN: [id number] Date of Birth: May 07, 1975 Referring Provider (PT): Sunnie Nielsen, DO   Encounter Date: 04/16/2020   PT End of Session - 04/16/20 0939    Visit Number 1    Number of Visits 13    Date for PT Re-Evaluation 05/28/20    Authorization Type BCBS    PT Start Time 0845    PT Stop Time 0932    PT Time Calculation (min) 47 min    Activity Tolerance Patient tolerated treatment well    Behavior During Therapy [facility] for tasks assessed/performed           Past Medical History:  Diagnosis Date  . High cholesterol     Past Surgical History:  Procedure Laterality Date  . ARTHROSCOPIC REPAIR ACL     Bilateral Patella Area  . SHOULDER ARTHROSCOPY W/ LABRAL REPAIR      There were no vitals filed for this visit.    Subjective Assessment - 04/16/20 0846    Subjective Patient reports progressive neck and L shoulder pain for the past 5 months. L shoulder pain occurs over the superior-posterior shoulder with intermittent radiation to the arm above the elbow. Noticed weakness and fatigue in the L hand and pain with L sidelying, forward reaching. Notes intermittent and short durations of N/T in the arm above the elbow, most often occurring when looking down and reading. Neck pain occurs over the L lateral aspect, worse when turning to the L. Has not tried ice or heat.    Pertinent History HLD, L shoulder arthroscopy with labral repair; ACL repair    Limitations House hold activities;Standing;Lifting;Sitting;Reading    Diagnostic tests 04/02/20 L shoulder xray- no acute changes; chronic AC joint injury (from patient's phone)    Patient Stated Goals decrease pain    Currently in Pain? Yes    Pain Score 2     Pain Location Neck    Pain Orientation Left     Pain Descriptors / Indicators Discomfort;Tightness    Pain Type Chronic pain    Pain Radiating Towards L arm above elbow              [facility] PT Assessment - 04/16/20 0854      Assessment   Medical Diagnosis Neck pain, chronic L shoulder pain, cervical radiculopathy    Referring Provider (PT) Sunnie Nielsen, DO    Onset Date/Surgical Date 11/15/19    Hand Dominance Left    Next MD Visit not scheduled    Prior Therapy yes      Precautions   Precautions None      Balance Screen   Has the patient fallen in the past 6 months No    Has the patient had a decrease in activity level because of a fear of falling?  No    Is the patient reluctant to leave their home because of a fear of falling?  No      Home Environment   Living Environment Private residence    Living Arrangements Spouse/significant other;Children    Available Help at Discharge Family    Type of Home House      Prior Function   Level of Independence Independent    Vocation Full time employment    Vocation Requirements lifting 50lbs, sit, stand, walk  Leisure none      Cognition   Overall Cognitive Status Within Functional Limits for tasks assessed      Sensation   Light Touch Appears Intact      Coordination   Gross Motor Movements are Fluid and Coordinated Yes      Posture/Postural Control   Posture/Postural Control Postural limitations    Postural Limitations Rounded Shoulders    Posture Comments increased cervical lordosis; B shoulders elevated      ROM / Strength   AROM / PROM / Strength AROM;Strength      AROM   AROM Assessment Site Shoulder;Cervical    Right/Left Shoulder Right;Left    Right Shoulder Flexion 167 Degrees    Right Shoulder ABduction 180 Degrees    Right Shoulder Internal Rotation --   FIR T5   Right Shoulder External Rotation --   FER T2   Left Shoulder Flexion 170 Degrees   mild pain in posterior shoulder   Left Shoulder ABduction 176 Degrees   c/o "pop" and pain in  posterior arm and UT   Left Shoulder Internal Rotation --   FIR T5; pain in elbow   Left Shoulder External Rotation --   FER T2; pain in posterior shoulder   Cervical Flexion 55    Cervical Extension 66   tightness   Cervical - Right Side Bend 58    Cervical - Left Side Bend 55    Cervical - Right Rotation 69   mild tightness   Cervical - Left Rotation 71   tightness     Strength   Strength Assessment Site Shoulder;Elbow;Wrist;Hand    Right/Left Shoulder Right;Left    Right Shoulder Flexion 4+/5    Right Shoulder ABduction 4+/5    Right Shoulder Internal Rotation 4+/5    Right Shoulder External Rotation 4+/5    Left Shoulder Flexion 4+/5    Left Shoulder ABduction 4+/5    Left Shoulder Internal Rotation 4+/5    Left Shoulder External Rotation 4/5    Right/Left Elbow Right;Left    Right Elbow Flexion 4+/5    Right Elbow Extension 4+/5    Left Elbow Flexion 4+/5    Left Elbow Extension 4+/5    Right/Left Wrist Right;Left    Right Wrist Flexion 4+/5    Right Wrist Extension 4/5    Left Wrist Flexion 4+/5    Left Wrist Extension 4+/5    Right/Left hand Right;Left    Right Hand Grip (lbs) 35   35, 35, 35   Left Hand Grip (lbs) 35   40, 40 25     Palpation   Palpation comment TTP over L lower cervical paraspinals, anterior pec, proximal biceps tendon and muscle belly, lateral deltoid; R biceps muscle belly; considerable soft tissue restriction throughout L shoulder      Special Tests    Special Tests Cervical    Cervical Tests Spurling's;Dictraction      Spurling's   Findings Negative    Comment no change      Distraction Test   Findngs Negative    Comment no change                      Objective measurements completed on examination: See above findings.               PT Education - 04/16/20 0939    Education Details prognosis, POC, HEP    Person(s) Educated Patient    Methods Explanation;Demonstration;Tactile cues;Verbal cues;Handout  Comprehension Verbalized understanding;Returned demonstration            PT Short Term Goals - 04/16/20 0945      PT SHORT TERM GOAL #1   Title Patient to be independent with initial HEP.    Time 3    Period Weeks    Status New    Target Date 05/07/20             PT Long Term Goals - 04/16/20 0945      PT LONG TERM GOAL #1   Title Patient to be independent with advanced HEP.    Time 6    Period Weeks    Status New    Target Date 05/28/20      PT LONG TERM GOAL #2   Title Patient to demonstrate L shoulder AROM WFL and without pain limiting.    Time 6    Period Weeks    Status New    Target Date 05/28/20      PT LONG TERM GOAL #3   Title Patient to demonstrate cervical AROM WFL and without pain limiting.    Time 6    Period Weeks    Status New    Target Date 05/28/20      PT LONG TERM GOAL #4   Title Patient to recall and demonstrate postural correction at rest and with activity for carryover at work/home.    Time 6    Period Weeks    Status New    Target Date 05/28/20      PT LONG TERM GOAL #5   Title Patient to report 80% improvement in pain.    Time 6    Period Weeks    Status New    Target Date 05/28/20                  Plan - 04/16/20 [id number]    Clinical Impression Statement Patient is a 45 y/o M presenting to OPPT with c/o progressive L shoulder and neck pain for the past 5 months without inciting injury. Pain occurs over the L UT with intermittent radiation to the arm above the elbow. Notes intermittent N/T in this same distribution most often occurring when looking down and reading. Pain worse when turning head to the L, L sidelying, forward reaching. Also notes weakness in the L hand. Patient today presenting with rounded and elevated shoulders with forward head posture, painful/tight L shoulder and cervical AROM, L shoulder ER weakness, and TTP over L lower cervical paraspinals, anterior pec, proximal biceps tendon and muscle belly, lateral  deltoid, and with considerable soft tissue restriction throughout L shoulder. Patient was educated on gentle stretching and postural correction HEP- patient reported understanding. Would benefit from skilled PT services 1-2x/week for 6 weeks to address aforementioned impairments.    Personal Factors and Comorbidities Age;Comorbidity 3+;Fitness;Past/Current Experience;Profession;Time since onset of injury/illness/exacerbation    Comorbidities HLD, shoulder arthroscopy with labral repair; ACL repair    Examination-Activity Limitations Bed Mobility;Sleep;Sit;Carry;Stand;Dressing;Transfers;Hygiene/Grooming;Lift;Reach Overhead;Self Feeding    Examination-Participation Restrictions Cleaning;Church;Community Activity;Shop;Driving;Yard Work;Laundry;Meal Prep;Occupation    Stability/Clinical Decision Making Evolving/Moderate complexity    Clinical Decision Making Low    Rehab Potential Good    PT Frequency Other (comment)   1-2x   PT Duration 6 weeks    PT Treatment/Interventions ADLs/Self Care Home Management;Cryotherapy;Electrical Stimulation;Iontophoresis 4mg /ml Dexamethasone;Moist Heat;Traction;Therapeutic exercise;Therapeutic activities;Functional mobility training;Ultrasound;Neuromuscular re-education;Patient/family education;Manual techniques;Vasopneumatic Device;Taping;Energy conservation;Dry needling;Passive range of motion    PT Next Visit Plan cervical FOTO; reassess HEP; cervical and  shoulder stretching and postural strengthenign    Consulted and Agree with Plan of Care Patient           Patient will benefit from skilled therapeutic intervention in order to improve the following deficits and impairments:  Decreased activity tolerance,Pain,Impaired UE functional use,Increased fascial restricitons,Decreased strength,Increased muscle spasms,Improper body mechanics,Decreased range of motion,Impaired flexibility,Postural dysfunction  Visit Diagnosis: Chronic left shoulder  pain  Cervicalgia  Other muscle spasm  Abnormal posture     Problem List Patient Active Problem List   Diagnosis Date Noted  . Symptoms consistent with irritable bowel syndrome 12/13/2017  . Chronic pain of both knees 12/13/2017  . Chronic bilateral low back pain without sciatica 12/13/2017     Anette Guarneri, PT, DPT 04/16/20 9:48 AM   [facility] [address]  [address] [place], [place], [address] Phone: [phone]   Fax:  [phone]  Name: Johnathan Oneal MRN: [id number] Date of Birth: Jan 29, 1976

## 2020-04-21 ENCOUNTER — Ambulatory Visit (INDEPENDENT_AMBULATORY_CARE_PROVIDER_SITE_OTHER): Payer: BC Managed Care – PPO | Admitting: Otolaryngology

## 2020-04-21 ENCOUNTER — Encounter (INDEPENDENT_AMBULATORY_CARE_PROVIDER_SITE_OTHER): Payer: Self-pay | Admitting: Otolaryngology

## 2020-04-21 ENCOUNTER — Other Ambulatory Visit: Payer: Self-pay

## 2020-04-21 VITALS — Temp 97.3°F

## 2020-04-21 DIAGNOSIS — J310 Chronic rhinitis: Secondary | ICD-10-CM | POA: Diagnosis not present

## 2020-04-21 DIAGNOSIS — R0683 Snoring: Secondary | ICD-10-CM

## 2020-04-21 DIAGNOSIS — J342 Deviated nasal septum: Secondary | ICD-10-CM

## 2020-04-21 DIAGNOSIS — J3489 Other specified disorders of nose and nasal sinuses: Secondary | ICD-10-CM

## 2020-04-21 NOTE — Progress Notes (Signed)
HPI: Johnathan Oneal is a 45 y.o. male who presents is referred by his PCP Dr. Lyn Hollingshead for evaluation of worsening of his snoring.  Patient states that he has always had snoring and actually underwent a sleep test with [facility] over 5 years ago that showed mild OSA.  However over the past year he feels like his snoring is gotten worse as his wife complains more about his snoring and he does not feel like he sleeps as well.  He states that he is more of a mouth breather as he has chronic nasal congestion.  He got a mouth splint from his dentist that apparently pulls his jaw forward and seems to help a little bit.  He is referred here mostly because of worsening of his snoring..  Past Medical History:  Diagnosis Date  . High cholesterol    Past Surgical History:  Procedure Laterality Date  . ARTHROSCOPIC REPAIR ACL     Bilateral Patella Area  . SHOULDER ARTHROSCOPY W/ LABRAL REPAIR     Social History   Socioeconomic History  . Marital status: Married    Spouse name: Not on file  . Number of children: Not on file  . Years of education: Not on file  . Highest education level: Not on file  Occupational History  . Occupation: Air cabin crew: [employer]  Tobacco Use  . Smoking status: Never Smoker  . Smokeless tobacco: Never Used  Vaping Use  . Vaping Use: Never used  Substance and Sexual Activity  . Alcohol use: Not Currently  . Drug use: Never  . Sexual activity: Yes    Partners: Female    Birth control/protection: Pill  Other Topics Concern  . Not on file  Social History Narrative  . Not on file   Social Determinants of Health   Financial Resource Strain: Not on file  Food Insecurity: Not on file  Transportation Needs: Not on file  Physical Activity: Not on file  Stress: Not on file  Social Connections: Not on file   Family History  Problem Relation Age of Onset  . Breast cancer Mother   . High blood pressure Father   . Diabetes Father   .  Heart attack Maternal Grandfather   . Canavan disease Paternal Grandmother   . Cancer Paternal Grandmother   . Diabetes Paternal Grandfather    Allergies  Allergen Reactions  . Penicillins     As per pt - had a reaction as a child. Does not recall what type of reaction to med.    Prior to Admission medications   Medication Sig Start Date End Date Taking? Authorizing Provider  fexofenadine (ALLEGRA) 180 MG tablet Take by mouth.    [provider]  meloxicam (MOBIC) 15 MG tablet Take 1 tablet (15 mg total) by mouth daily. 04/02/20   Sunnie Nielsen, DO  pravastatin (PRAVACHOL) 40 MG tablet TAKE 1 TABLET(40 MG) BY MOUTH DAILY 04/01/20   Sunnie Nielsen, DO     Positive ROS: Otherwise negative  All other systems have been reviewed and were otherwise negative with the exception of those mentioned in the HPI and as above.  Physical Exam: Constitutional: Alert, well-appearing, no acute distress Ears: External ears without lesions or tenderness. Ear canals are clear bilaterally with intact, clear TMs.  Nasal: External nose without lesions. Septum with deviation to the left.  He has slight tendency for nasal valve collapse on inhalation.  He has moderate rhinitis.  After decongesting the  nose nasal passages were otherwise clear with no polyps or obstructive lesions noted.  Both middle meatus regions were clear with no signs of infection. Oral: Lips and gums without lesions. Tongue and palate mucosa without lesions. Posterior oropharynx clear.  Patient has average sized tonsils bilaterally which are symmetric.  Of note he does have an elongated uvula. Neck: No palpable adenopathy or masses Respiratory: Breathing comfortably  Skin: No facial/neck lesions or rash noted.  Procedures  Assessment: Increasing snoring.  Questionable obstructive sleep apnea. Nasal congestion with mild tendency for nasal valve collapse.  Plan: Initially recommended regular use of nasal steroid spray and  suggested Flonase 2 sprays at night.  Also discussed with him concerning for 2 or 3 nights to try use of Afrin and Breathe Right strips in order to maximize the nasal airway to see how much this improves his snoring. We will go ahead and schedule him for a sleep test to evaluate the degree of sleep apnea and have him follow-up in a month for recheck.   Narda Bonds, MD   CC:

## 2020-04-23 ENCOUNTER — Other Ambulatory Visit: Payer: Self-pay

## 2020-04-23 ENCOUNTER — Ambulatory Visit: Payer: BC Managed Care – PPO | Attending: Osteopathic Medicine

## 2020-04-23 DIAGNOSIS — M25512 Pain in left shoulder: Secondary | ICD-10-CM | POA: Diagnosis not present

## 2020-04-23 DIAGNOSIS — M62838 Other muscle spasm: Secondary | ICD-10-CM | POA: Insufficient documentation

## 2020-04-23 DIAGNOSIS — R293 Abnormal posture: Secondary | ICD-10-CM | POA: Insufficient documentation

## 2020-04-23 DIAGNOSIS — G8929 Other chronic pain: Secondary | ICD-10-CM | POA: Insufficient documentation

## 2020-04-23 DIAGNOSIS — M542 Cervicalgia: Secondary | ICD-10-CM | POA: Diagnosis not present

## 2020-04-23 NOTE — Therapy (Signed)
[facility] [address]  [address] [place], [place], [address] Phone: [phone]   Fax:  [phone]  Physical Therapy Treatment  Patient Details  Name: Johnathan Oneal MRN: [id number] Date of Birth: Oct 25, 1975 Referring Provider (PT): Johnathan Nielsen, DO   Encounter Date: 04/23/2020   PT End of Session - 04/23/20 0848    Visit Number 2    Number of Visits 13    Date for PT Re-Evaluation 05/28/20    Authorization Type BCBS    PT Start Time 0806    PT Stop Time 0847    PT Time Calculation (min) 41 min    Activity Tolerance Patient tolerated treatment well    Behavior During Therapy [facility] for tasks assessed/performed           Past Medical History:  Diagnosis Date  . High cholesterol     Past Surgical History:  Procedure Laterality Date  . ARTHROSCOPIC REPAIR ACL     Bilateral Patella Area  . SHOULDER ARTHROSCOPY W/ LABRAL REPAIR      There were no vitals filed for this visit.   Subjective Assessment - 04/23/20 0809    Subjective Pt reports that the exercises have been going good but pec stretch recreates N/T in arm.    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 Yes    Pain Score 4     Pain Location Shoulder    Pain Orientation Left;Anterior;Posterior    Pain Descriptors / Indicators Aching    Pain Type Chronic pain                             [facility] Adult PT Treatment/Exercise - 04/23/20 0001      Exercises   Exercises Shoulder;Knee/Hip      Knee/Hip Exercises: Aerobic   Nustep L2x33min      Shoulder Exercises: Standing   External Rotation Strengthening;Left;20 reps;Theraband    Theraband Level (Shoulder External Rotation) Level 2 (Red)    Flexion Strengthening;Both;10  reps;Weights    Shoulder Flexion Weight (lbs) 2    Extension Strengthening;Both;20 reps;Theraband    Theraband Level (Shoulder Extension) Level 2 (Red)    Row Strengthening;Both;20 reps;Theraband    Theraband Level (Shoulder Row) Level 2 (Red)    Other Standing Exercises scaption 10x, 2#    Other Standing Exercises serratus slide 10x with R tband      Shoulder Exercises: Stretch   Other Shoulder Stretches UT and levator 2x30 B    Other Shoulder Stretches pec doorway stretch 30x                    PT Short Term Goals - 04/16/20 0945      PT SHORT TERM GOAL #1   Title Patient to be independent with initial HEP.    Time 3    Period Weeks    Status New    Target Date 05/07/20             PT Long Term Goals - 04/16/20 0945      PT LONG TERM GOAL #1   Title Patient to be independent with advanced HEP.    Time 6    Period Weeks  Status New    Target Date 05/28/20      PT LONG TERM GOAL #2   Title Patient to demonstrate L shoulder AROM WFL and without pain limiting.    Time 6    Period Weeks    Status New    Target Date 05/28/20      PT LONG TERM GOAL #3   Title Patient to demonstrate cervical AROM WFL and without pain limiting.    Time 6    Period Weeks    Status New    Target Date 05/28/20      PT LONG TERM GOAL #4   Title Patient to recall and demonstrate postural correction at rest and with activity for carryover at work/home.    Time 6    Period Weeks    Status New    Target Date 05/28/20      PT LONG TERM GOAL #5   Title Patient to report 80% improvement in pain.    Time 6    Period Weeks    Status New    Target Date 05/28/20                 Plan - 04/23/20 0849    Clinical Impression Statement Reviewed HEP with patient, he noted that he didn't need to go over the exercises this session but did have N/T with pec stretch. Modified exercises for pt and afterwards have no more c/o of N/T. Pt responded well, no reports of pain during  session. Cueing to keep scap from elevating and to retract shoulders during strengthening exercises. Added more scap stability exercises to pt HEP, handout given to pt and pt verbalized and demonstrated understanding. Educated pt on the importance of postural exercises to prevent excessive stress on shoulders.    Personal Factors and Comorbidities Age;Comorbidity 3+;Fitness;Past/Current Experience;Profession;Time since onset of injury/illness/exacerbation    Comorbidities HLD, shoulder arthroscopy with labral repair; ACL repair    PT Frequency Other (comment)    PT Duration 6 weeks    PT Treatment/Interventions ADLs/Self Care Home Management;Cryotherapy;Electrical Stimulation;Iontophoresis 4mg /ml Dexamethasone;Moist Heat;Traction;Therapeutic exercise;Therapeutic activities;Functional mobility training;Ultrasound;Neuromuscular re-education;Patient/family education;Manual techniques;Vasopneumatic Device;Taping;Energy conservation;Dry needling;Passive range of motion    PT Next Visit Plan cervical FOTO; cervical and shoulder stretching and postural strengthenign    Consulted and Agree with Plan of Care Patient           Patient will benefit from skilled therapeutic intervention in order to improve the following deficits and impairments:  Decreased activity tolerance,Pain,Impaired UE functional use,Increased fascial restricitons,Decreased strength,Increased muscle spasms,Improper body mechanics,Decreased range of motion,Impaired flexibility,Postural dysfunction  Visit Diagnosis: Chronic left shoulder pain  Cervicalgia  Other muscle spasm  Abnormal posture     Problem List Patient Active Problem List   Diagnosis Date Noted  . Symptoms consistent with irritable bowel syndrome 12/13/2017  . Chronic pain of both knees 12/13/2017  . Chronic bilateral low back pain without sciatica 12/13/2017    12/15/2017, PTA 04/23/2020, 9:01 AM  [facility] [address]  [address] [place], [place], [place] Phone: [phone]   Fax:  [phone]  Name: Johnathan Oneal MRN: [name] Date of Birth: 07/25/1975

## 2020-04-30 ENCOUNTER — Encounter: Payer: Self-pay | Admitting: Physical Therapy

## 2020-04-30 ENCOUNTER — Other Ambulatory Visit: Payer: Self-pay

## 2020-04-30 ENCOUNTER — Ambulatory Visit: Payer: BC Managed Care – PPO | Admitting: Physical Therapy

## 2020-04-30 DIAGNOSIS — M25512 Pain in left shoulder: Secondary | ICD-10-CM

## 2020-04-30 DIAGNOSIS — R293 Abnormal posture: Secondary | ICD-10-CM

## 2020-04-30 DIAGNOSIS — M62838 Other muscle spasm: Secondary | ICD-10-CM

## 2020-04-30 DIAGNOSIS — M542 Cervicalgia: Secondary | ICD-10-CM

## 2020-04-30 DIAGNOSIS — G8929 Other chronic pain: Secondary | ICD-10-CM

## 2020-04-30 NOTE — Therapy (Signed)
[facility]patient Rehabilitation [facility] [address]  [address] [place], [place], [address] Phone: [phone]   Fax:  [phone]  Physical Therapy Treatment  Patient Details  Name: Johnathan Oneal MRN: [id number] Date of Birth: 1976-02-21 Referring Provider (PT): Sunnie Nielsen, DO   Encounter Date: 04/30/2020   PT End of Session - 04/30/20 0931    Visit Number 3    Number of Visits 13    Date for PT Re-Evaluation 05/28/20    Authorization Type BCBS    PT Start Time 0845    PT Stop Time 0927    PT Time Calculation (min) 42 min    Activity Tolerance Patient tolerated treatment well;Patient limited by pain    Behavior During Therapy [facility] for tasks assessed/performed           Past Medical History:  Diagnosis Date  . High cholesterol     Past Surgical History:  Procedure Laterality Date  . ARTHROSCOPIC REPAIR ACL     Bilateral Patella Area  . SHOULDER ARTHROSCOPY W/ LABRAL REPAIR      There were no vitals filed for this visit.   Subjective Assessment - 04/30/20 0846    Subjective Reports that he feels that he is getting better but still having some discomfort when laying on the L side. Denies questions/concerns on HEP.    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 No/denies              [facility] PT Assessment - 04/30/20 0001      Observation/Other Assessments   Focus on Therapeutic Outcomes (FOTO)  Neck: 68                         [facility] Adult PT Treatment/Exercise - 04/30/20 0001      Shoulder Exercises: Seated   Horizontal ABduction Strengthening;Both;10 reps;Theraband    Theraband Level (Shoulder Horizontal ABduction) Level 2 (Red)    Horizontal ABduction Limitations cues for scap retraction    External  Rotation Strengthening;Both;10 reps;Theraband    Theraband Level (Shoulder External Rotation) Level 2 (Red)    External Rotation Limitations cues to maintain 90 deg elbow flexion      Shoulder Exercises: Prone   Other Prone Exercises attempted L prone I, T, Y's over grreen pball 10x   discontinued d/t L anterior shoulder pain     Shoulder Exercises: Standing   Other Standing Exercises B Y lift offs from wall 10x   cues for slight posterior pelvic tilt     Shoulder Exercises: ROM/Strengthening   UBE (Upper Arm Bike) L2.0 x 3 min forward/3 min back    Cybex Row Limitations 2x10 30# narrow grip   good form     Manual Therapy   Manual Therapy Soft tissue mobilization;Myofascial release    Manual therapy comments sitting    Soft tissue mobilization STM to L pec, proximal biceps tendon and muscle belly, UT    Myofascial Release manual TPR, DTM, and IASTM to L pec, proximal biceps tendon, and UT; cross friction massage to L proximal biceps tendon                  PT Education - 04/30/20 0931    Education Details update to HEP  Person(s) Educated Patient    Methods Explanation;Demonstration;Verbal cues;Tactile cues;Handout    Comprehension Verbalized understanding;Returned demonstration            PT Short Term Goals - 04/30/20 0932      PT SHORT TERM GOAL #1   Title Patient to be independent with initial HEP.    Time 3    Period Weeks    Status Achieved    Target Date 05/07/20             PT Long Term Goals - 04/30/20 0932      PT LONG TERM GOAL #1   Title Patient to be independent with advanced HEP.    Time 6    Period Weeks    Status On-going      PT LONG TERM GOAL #2   Title Patient to demonstrate L shoulder AROM WFL and without pain limiting.    Time 6    Period Weeks    Status On-going      PT LONG TERM GOAL #3   Title Patient to demonstrate cervical AROM WFL and without pain limiting.    Time 6    Period Weeks    Status On-going      PT LONG  TERM GOAL #4   Title Patient to recall and demonstrate postural correction at rest and with activity for carryover at work/home.    Time 6    Period Weeks    Status On-going      PT LONG TERM GOAL #5   Title Patient to report 80% improvement in pain.    Time 6    Period Weeks    Status On-going                 Plan - 04/30/20 0931    Clinical Impression Statement Patient arrived to session with report of improving pain in L shoulder and neck, however still having discomfort in L sidelying. Worked on progressing periscapular strengthening with light resistance. Patient able to demonstrate good periscapular activation after cueing to retract scapulae. Attempted prone periscapular strengthening which was discontinued d/t anterior L shoulder pain despite good form. Better tolerance was reported with "Y lift offs" at the wall rather than prone. Updated this exercise into HEP- patient reported understanding. Ended session with MT to address soft tissue restriction in L UT, biceps, and pec. Patient tolerated this well and without complaints at end of session.    Personal Factors and Comorbidities Age;Comorbidity 3+;Fitness;Past/Current Experience;Profession;Time since onset of injury/illness/exacerbation    Comorbidities HLD, shoulder arthroscopy with labral repair; ACL repair    PT Frequency Other (comment)    PT Duration 6 weeks    PT Treatment/Interventions ADLs/Self Care Home Management;Cryotherapy;Electrical Stimulation;Iontophoresis 4mg /ml Dexamethasone;Moist Heat;Traction;Therapeutic exercise;Therapeutic activities;Functional mobility training;Ultrasound;Neuromuscular re-education;Patient/family education;Manual techniques;Vasopneumatic Device;Taping;Energy conservation;Dry needling;Passive range of motion    PT Next Visit Plan cervical and shoulder stretching and postural strengthenign    Consulted and Agree with Plan of Care Patient           Patient will benefit from skilled  therapeutic intervention in order to improve the following deficits and impairments:  Decreased activity tolerance,Pain,Impaired UE functional use,Increased fascial restricitons,Decreased strength,Increased muscle spasms,Improper body mechanics,Decreased range of motion,Impaired flexibility,Postural dysfunction  Visit Diagnosis: Chronic left shoulder pain  Cervicalgia  Other muscle spasm  Abnormal posture     Problem List Patient Active Problem List   Diagnosis Date Noted  . Symptoms consistent with irritable bowel syndrome 12/13/2017  . Chronic pain of both  knees 12/13/2017  . Chronic bilateral low back pain without sciatica 12/13/2017     Anette Guarneri, PT, DPT 04/30/20 9:34 AM   [facility] [address]  [address] [place], [place], [address] Phone: [phone]   Fax:  [phone]  Name: Johnathan Oneal MRN: [id number] Date of Birth: [birth year]

## 2020-05-01 ENCOUNTER — Other Ambulatory Visit: Payer: Self-pay | Admitting: Osteopathic Medicine

## 2020-05-07 ENCOUNTER — Other Ambulatory Visit: Payer: Self-pay

## 2020-05-07 ENCOUNTER — Ambulatory Visit: Payer: BC Managed Care – PPO

## 2020-05-07 DIAGNOSIS — G8929 Other chronic pain: Secondary | ICD-10-CM | POA: Diagnosis not present

## 2020-05-07 DIAGNOSIS — R293 Abnormal posture: Secondary | ICD-10-CM

## 2020-05-07 DIAGNOSIS — M62838 Other muscle spasm: Secondary | ICD-10-CM | POA: Diagnosis not present

## 2020-05-07 DIAGNOSIS — M542 Cervicalgia: Secondary | ICD-10-CM

## 2020-05-07 DIAGNOSIS — M25512 Pain in left shoulder: Secondary | ICD-10-CM | POA: Diagnosis not present

## 2020-05-07 NOTE — Therapy (Signed)
[facility] [address]  [place], [place], [address] Phone: [phone]   Fax:  [phone]  Physical Therapy Treatment  Patient Details  Name: Johnathan Oneal MRN: [id number] Date of Birth: 1976-02-12 Referring Provider (PT): Emeterio Reeve, DO   Encounter Date: 05/07/2020   PT End of Session - 05/07/20 0819    Visit Number 4    Number of Visits 13    Date for PT Re-Evaluation 05/28/20    Authorization Type BCBS    PT Start Time 0800    PT Stop Time 0843    PT Time Calculation (min) 43 min    Activity Tolerance Patient tolerated treatment well    Behavior During Therapy [facility] for tasks assessed/performed           Past Medical History:  Diagnosis Date  . High cholesterol     Past Surgical History:  Procedure Laterality Date  . ARTHROSCOPIC REPAIR ACL     Bilateral Patella Area  . SHOULDER ARTHROSCOPY W/ LABRAL REPAIR      There were no vitals filed for this visit.   Subjective Assessment - 05/07/20 0803    Subjective Pt reports he has increased motion and flexibility of his L shoulder since coming into therapy.    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 No/denies              [facility] PT Assessment - 05/07/20 0001      AROM   Left Shoulder Flexion 162 Degrees   some pain at end range   Left Shoulder ABduction 175 Degrees    Cervical Flexion WFL    Cervical Extension WFL    Cervical - Right Side Bend 25%   c/o tightness   Cervical - Left Side Bend 25%   c/o tightness   Cervical - Right Rotation WFL    Cervical - Left Rotation [facility]                         [facility] Adult PT Treatment/Exercise - 05/07/20 0001      Exercises   Exercises Shoulder      Shoulder Exercises:  Standing   External Rotation Strengthening;Left;20 reps;Theraband    Theraband Level (Shoulder External Rotation) Level 3 (Green)    Internal Rotation Strengthening;Left;20 reps;Theraband    Theraband Level (Shoulder Internal Rotation) Level 3 (Green)      Shoulder Exercises: ROM/Strengthening   UBE (Upper Arm Bike) L2.0 x 3 min forward/3 min back    Lat Pull Limitations    Lat Pull Limitations 30# 2x10 reps    Cybex Row Limitations 2x10 30# narrow grip      Manual Therapy   Manual Therapy Soft tissue mobilization    Manual therapy comments sitting    Soft tissue mobilization STM to L levator an rhomboids                  PT Education - 05/07/20 0820    Education Details Education on posture sitting and standing, instructed pt to keep shoulder depressed and retracted and to prevent post pelvic tilting to properly align the spine.    Person(s) Educated Patient    Methods Explanation;Demonstration;Verbal cues    Comprehension Verbalized  understanding;Returned demonstration;Verbal cues required            PT Short Term Goals - 04/30/20 0932      PT SHORT TERM GOAL #1   Title Patient to be independent with initial HEP.    Time 3    Period Weeks    Status Achieved    Target Date 05/07/20             PT Long Term Goals - 05/07/20 0811      PT LONG TERM GOAL #1   Title Patient to be independent with advanced HEP.    Time 6    Period Weeks    Status On-going      PT LONG TERM GOAL #2   Title Patient to demonstrate L shoulder AROM WFL and without pain limiting.    Time 6    Period Weeks    Status Partially Met   still pain with flexion and abduction into end range but AROM WFL     PT LONG TERM GOAL #3   Title Patient to demonstrate cervical AROM WFL and without pain limiting.    Time 6    Period Weeks    Status On-going   Cervical AROM WFL except B side bends, also had tightness in both directions no pain with any motions     PT LONG TERM GOAL #4   Title  Patient to recall and demonstrate postural correction at rest and with activity for carryover at work/home.    Time 6    Period Weeks    Status On-going   Pt still requires cueing for posture corrections during sessions     PT LONG TERM GOAL #5   Title Patient to report 80% improvement in pain.    Time 6    Period Weeks    Status On-going   Pain has become more localized but has not gotten better in the localized areas.                Plan - 05/07/20 0844    Clinical Impression Statement Pt showed good demonstration of exercises with good form and min need for cueing. He still needed correction for his posture during the session. His cervical AROM is WFL in most planes but his B side bends still are limited by tightness but no pain with any motions. His shoulder AROM is still [facility] but causes some pain when going into the end ranges. Pt had TTP in his L rhomboid and levator during MT, instructed him on self massage with tennis in order to do this at home.    Personal Factors and Comorbidities Age;Comorbidity 3+;Fitness;Past/Current Experience;Profession;Time since onset of injury/illness/exacerbation    Comorbidities HLD, shoulder arthroscopy with labral repair; ACL repair    PT Frequency Other (comment)    PT Duration 6 weeks    PT Treatment/Interventions ADLs/Self Care Home Management;Cryotherapy;Electrical Stimulation;Iontophoresis 2m/ml Dexamethasone;Moist Heat;Traction;Therapeutic exercise;Therapeutic activities;Functional mobility training;Ultrasound;Neuromuscular re-education;Patient/family education;Manual techniques;Vasopneumatic Device;Taping;Energy conservation;Dry needling;Passive range of motion    PT Next Visit Plan cervical and shoulder stretching and postural strengthenigng    Consulted and Agree with Plan of Care Patient           Patient will benefit from skilled therapeutic intervention in order to improve the following deficits and impairments:  Decreased activity  tolerance,Pain,Impaired UE functional use,Increased fascial restricitons,Decreased strength,Increased muscle spasms,Improper body mechanics,Decreased range of motion,Impaired flexibility,Postural dysfunction  Visit Diagnosis: Chronic left shoulder pain  Cervicalgia  Other muscle spasm  Abnormal posture  Problem List Patient Active Problem List   Diagnosis Date Noted  . Symptoms consistent with irritable bowel syndrome 12/13/2017  . Chronic pain of both knees 12/13/2017  . Chronic bilateral low back pain without sciatica 12/13/2017    Artist Pais, PTA 05/07/2020, 9:05 AM  [facility] [address]  [place], [place], [address] Phone: [phone]   Fax:  [phone]  Name: Amaziah Raisanen MRN: [id number] Date of Birth: 08/27/1975

## 2020-05-14 ENCOUNTER — Ambulatory Visit: Payer: BC Managed Care – PPO | Admitting: Physical Therapy

## 2020-05-14 ENCOUNTER — Other Ambulatory Visit: Payer: Self-pay

## 2020-05-14 ENCOUNTER — Encounter: Payer: Self-pay | Admitting: Physical Therapy

## 2020-05-14 DIAGNOSIS — M62838 Other muscle spasm: Secondary | ICD-10-CM

## 2020-05-14 DIAGNOSIS — R293 Abnormal posture: Secondary | ICD-10-CM | POA: Diagnosis not present

## 2020-05-14 DIAGNOSIS — G8929 Other chronic pain: Secondary | ICD-10-CM | POA: Diagnosis not present

## 2020-05-14 DIAGNOSIS — M25512 Pain in left shoulder: Secondary | ICD-10-CM | POA: Diagnosis not present

## 2020-05-14 DIAGNOSIS — M542 Cervicalgia: Secondary | ICD-10-CM

## 2020-05-14 NOTE — Patient Instructions (Addendum)
    IONTOPHORESIS PATIENT PRECAUTIONS & CONTRAINDICATIONS:  Redness under one or both electrodes can occur.  This characterized by a uniform redness that usually disappears within 12 hours of treatment. Small pinhead size blisters may result in response to the drug.  Contact your physician if the problem persists more than 24 hours. On rare occasions, iontophoresis therapy can result in temporary skin reactions such as rash, inflammation, irritation or burns.  The skin reactions may be the result of individual sensitivity to the ionic solution used, the condition of the skin at the start of treatment, reaction to the materials in the electrodes, allergies or sensitivity to dexamethasone, or a poor connection between the patch and your skin.  Discontinue using iontophoresis if you have any of these reactions and report to your therapist. Remove the Patch or electrodes if you have any undue sensation of pain or burning during the treatment and report discomfort to your therapist. Tell your Therapist if you have had known adverse reactions to the application of electrical current. Approximate treatment time is 4-6 hours.  Remove the patch after 6 hours. The Patch can be worn during normal activity, however excessive motion where the electrodes have been placed can cause poor contact between the skin and the electrode or uneven electrical current resulting in greater risk of skin irritation. Keep out of the reach of children.   DO NOT use if you have a cardiac pacemaker or any other electrically sensitive implanted device. DO NOT use if you have a known sensitivity to dexamethasone. DO NOT use during Magnetic Resonance Imaging (MRI). DO NOT use over broken or compromised skin (e.g. sunburn, cuts, or acne) due to the increased risk of skin reaction. DO NOT SHAVE over the area to be treated:  To establish good contact between the Patch and the skin, excessive hair may be clipped. DO NOT place the  Patch or electrodes on or over your eyes, directly over your heart, or brain. DO NOT reuse the Patch or electrodes as this may cause burns to occur.   For questions, please contact your therapist at:  [facility] [place] [address]  [address] [place], [place], [address] Phone: [phone]   Fax:  [phone]  

## 2020-05-14 NOTE — Therapy (Signed)
[facility] [place] [address]  [place], [place], [address] Phone: [phone]   Fax:  [phone]  Physical Therapy Treatment  Patient Details  Name: Johnathan Oneal MRN: [id number] Date of Birth: 25-Feb-1975 Referring Provider (PT): Emeterio Reeve, DO   Encounter Date: 05/14/2020   PT End of Session - 05/14/20 0928    Visit Number 5    Number of Visits 13    Date for PT Re-Evaluation 05/28/20    Authorization Type BCBS    PT Start Time 0845    PT Stop Time 0925    PT Time Calculation (min) 40 min    Activity Tolerance Patient tolerated treatment well    Behavior During Therapy [facility] for tasks assessed/performed           Past Medical History:  Diagnosis Date  . High cholesterol     Past Surgical History:  Procedure Laterality Date  . ARTHROSCOPIC REPAIR ACL     Bilateral Patella Area  . SHOULDER ARTHROSCOPY W/ LABRAL REPAIR      There were no vitals filed for this visit.   Subjective Assessment - 05/14/20 0845    Subjective Doing alright. Still having pain in the shoulder when reaching. The tightness is improved.    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 No/denies                             [facility] Adult PT Treatment/Exercise - 05/14/20 0001      Shoulder Exercises: Seated   Other Seated Exercises cervical retraction with yellow TB 2x10   good ROM and form     Shoulder Exercises: Prone   Other Prone Exercises L prone I, T, Y's over grreen pball 10x   improved tolerance     Shoulder Exercises: Standing   External Rotation Strengthening;Left;Theraband;10 reps    Theraband Level (Shoulder External Rotation) Level 3 (Green)    External Rotation Limitations cues for posture     Internal Rotation Strengthening;Left;Theraband;10 reps    Theraband Level (Shoulder Internal Rotation) Level 3 (Green)    Internal Rotation Limitations cues for posture    Extension Strengthening;Both;Theraband;10 reps    Theraband Level (Shoulder Extension) Level 2 (Red)    Extension Weight (lbs) cues to maintain straight elbows and core contracted    Row Strengthening;Both;Theraband;10 reps    Theraband Level (Shoulder Row) Level 3 (Green)    Row Limitations cues for slight chin tuck      Shoulder Exercises: ROM/Strengthening   UBE (Upper Arm Bike) L2.5 x 3 min forward/3 min back      Modalities   Modalities Iontophoresis      Iontophoresis   Type of Iontophoresis Dexamethasone    Location L anterior shoulder    Dose 1.0 ml; 89m*min    Time 4 hour patch                  PT Education - 05/14/20 0927    Education Details edu on ionto use, wear time, removal, precautions; increased challege with HEP-Access Code [email or web address]    Person(s) Educated Patient    Methods Explanation;Demonstration;Tactile cues;Verbal cues;Handout    Comprehension Verbalized understanding;Returned demonstration  PT Short Term Goals - 04/30/20 0932      PT SHORT TERM GOAL #1   Title Patient to be independent with initial HEP.    Time 3    Period Weeks    Status Achieved    Target Date 05/07/20             PT Long Term Goals - 05/07/20 0811      PT LONG TERM GOAL #1   Title Patient to be independent with advanced HEP.    Time 6    Period Weeks    Status On-going      PT LONG TERM GOAL #2   Title Patient to demonstrate L shoulder AROM WFL and without pain limiting.    Time 6    Period Weeks    Status Partially Met   still pain with flexion and abduction into end range but AROM WFL     PT LONG TERM GOAL #3   Title Patient to demonstrate cervical AROM WFL and without pain limiting.    Time 6    Period Weeks    Status On-going   Cervical AROM WFL except B side bends,  also had tightness in both directions no pain with any motions     PT LONG TERM GOAL #4   Title Patient to recall and demonstrate postural correction at rest and with activity for carryover at work/home.    Time 6    Period Weeks    Status On-going   Pt still requires cueing for posture corrections during sessions     PT LONG TERM GOAL #5   Title Patient to report 80% improvement in pain.    Time 6    Period Weeks    Status On-going   Pain has become more localized but has not gotten better in the localized areas.                Plan - 05/14/20 0929    Clinical Impression Statement Patient reports remaining pain in the L shoulder with reaching, however reporting improvement in sensation of tightness in his neck.Worked on periscapular strengthening in prone with patient demonstrating improved tolerance today. Noting muscle fatigue rather than shoulder pain. Patient still with slouched and forward head posture with standing ther-ex, requiring correction to improve alignment and shoulder irritation. Able to perform cervical retraction with added resistance with good form and ROM. Patient was educated on iontophoresis use, benefit, wear time, safety/precautions and reported understanding. Ended session with ionto application to L shoulder. Patient without complaints at end of session and tolerated entire session today without increase in pain.    Personal Factors and Comorbidities Age;Comorbidity 3+;Fitness;Past/Current Experience;Profession;Time since onset of injury/illness/exacerbation    Comorbidities HLD, shoulder arthroscopy with labral repair; ACL repair    PT Frequency Other (comment)    PT Duration 6 weeks    PT Treatment/Interventions ADLs/Self Care Home Management;Cryotherapy;Electrical Stimulation;Iontophoresis 37m/ml Dexamethasone;Moist Heat;Traction;Therapeutic exercise;Therapeutic activities;Functional mobility training;Ultrasound;Neuromuscular re-education;Patient/family  education;Manual techniques;Vasopneumatic Device;Taping;Energy conservation;Dry needling;Passive range of motion    PT Next Visit Plan cervical and shoulder stretching and postural strengthenigng    Consulted and Agree with Plan of Care Patient           Patient will benefit from skilled therapeutic intervention in order to improve the following deficits and impairments:  Decreased activity tolerance,Pain,Impaired UE functional use,Increased fascial restricitons,Decreased strength,Increased muscle spasms,Improper body mechanics,Decreased range of motion,Impaired flexibility,Postural dysfunction  Visit Diagnosis: Chronic left shoulder pain  Cervicalgia  Other muscle spasm  Abnormal posture     Problem List Patient Active Problem List   Diagnosis Date Noted  . Symptoms consistent with irritable bowel syndrome 12/13/2017  . Chronic pain of both knees 12/13/2017  . Chronic bilateral low back pain without sciatica 12/13/2017     Janene Harvey, PT, DPT 05/14/20 9:32 AM   [facility] [address]  [place], [place], [address] Phone: [phone]   Fax:  [phone]  Name: Johnathan Oneal MRN: [id number] Date of Birth: May 18, 1975

## 2020-05-22 ENCOUNTER — Other Ambulatory Visit: Payer: Self-pay

## 2020-05-22 ENCOUNTER — Ambulatory Visit: Payer: BC Managed Care – PPO | Admitting: Physical Therapy

## 2020-05-22 ENCOUNTER — Encounter: Payer: Self-pay | Admitting: Physical Therapy

## 2020-05-22 DIAGNOSIS — R293 Abnormal posture: Secondary | ICD-10-CM | POA: Diagnosis not present

## 2020-05-22 DIAGNOSIS — M542 Cervicalgia: Secondary | ICD-10-CM | POA: Diagnosis not present

## 2020-05-22 DIAGNOSIS — G8929 Other chronic pain: Secondary | ICD-10-CM

## 2020-05-22 DIAGNOSIS — M25512 Pain in left shoulder: Secondary | ICD-10-CM | POA: Diagnosis not present

## 2020-05-22 DIAGNOSIS — M62838 Other muscle spasm: Secondary | ICD-10-CM | POA: Diagnosis not present

## 2020-05-22 NOTE — Therapy (Signed)
[facility] [place] [address]  [place], [place], [address] Phone: [phone]   Fax:  [phone]  Physical Therapy Treatment  Patient Details  Name: Johnathan Oneal MRN: [id number] Date of Birth: 09/03/1975 Referring Provider (PT): Emeterio Reeve, DO   Encounter Date: 05/22/2020   PT End of Session - 05/22/20 1011    Visit Number 6    Number of Visits 13    Date for PT Re-Evaluation 05/28/20    Authorization Type BCBS    PT Start Time 0927    PT Stop Time 1008    PT Time Calculation (min) 41 min    Activity Tolerance Patient tolerated treatment well;Patient limited by pain    Behavior During Therapy [facility] for tasks assessed/performed           Past Medical History:  Diagnosis Date  . High cholesterol     Past Surgical History:  Procedure Laterality Date  . ARTHROSCOPIC REPAIR ACL     Bilateral Patella Area  . SHOULDER ARTHROSCOPY W/ LABRAL REPAIR      There were no vitals filed for this visit.   Subjective Assessment - 05/22/20 0929    Subjective Increased pain in his L shoulder on Thursday without known cause. Since then, pain levels have returned to baseline. Denies skin irritation other than redness from ionto.    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 No/denies                             [facility] Adult PT Treatment/Exercise - 05/22/20 0001      Shoulder Exercises: Supine   Other Supine Exercises DNF lift offs 10x5"   good form     Shoulder Exercises: Prone   Other Prone Exercises alt bird dog 10x   manual and verbal cues to maintain neutral spine and avoid hip rotation   Other Prone Exercises quadruped serratus punches 2x10   cues to maintain neck neutral; holding onto  dumbbells to improve wrist pain     Shoulder Exercises: Sidelying   External Rotation Strengthening;Left;10 reps;Weights    External Rotation Weight (lbs) 2    External Rotation Limitations shoulder in neutral    ABduction Strengthening;Left;10 reps;Weights    ABduction Weight (lbs) 2, 3    ABduction Limitations thumb up; 10x 2#, 5x 3#   good ROM and control     Shoulder Exercises: Standing   Other Standing Exercises TRX high plank walkouts 10x; decline row 10x   improved tolerance d/t wrist pain, muscle shaking     Shoulder Exercises: ROM/Strengthening   UBE (Upper Arm Bike) L2.5 x 3 min forward/3 min back      Shoulder Exercises: Stretch   Other Shoulder Stretches R/ L UT stretch with strap assist 30"                  PT Education - 05/22/20 1010    Education Details advised patient to consider f/u with MD to further address persisting L shoulder pain- adminstered contact info for [facility]    Person(s) Educated Patient    Methods Explanation;Demonstration;Tactile cues;Verbal cues;Handout    Comprehension Verbalized understanding  PT Short Term Goals - 04/30/20 0932      PT SHORT TERM GOAL #1   Title Patient to be independent with initial HEP.    Time 3    Period Weeks    Status Achieved    Target Date 05/07/20             PT Long Term Goals - 05/07/20 0811      PT LONG TERM GOAL #1   Title Patient to be independent with advanced HEP.    Time 6    Period Weeks    Status On-going      PT LONG TERM GOAL #2   Title Patient to demonstrate L shoulder AROM WFL and without pain limiting.    Time 6    Period Weeks    Status Partially Met   still pain with flexion and abduction into end range but AROM WFL     PT LONG TERM GOAL #3   Title Patient to demonstrate cervical AROM WFL and without pain limiting.    Time 6    Period Weeks    Status On-going   Cervical AROM WFL except B side bends, also had tightness in both directions no pain  with any motions     PT LONG TERM GOAL #4   Title Patient to recall and demonstrate postural correction at rest and with activity for carryover at work/home.    Time 6    Period Weeks    Status On-going   Pt still requires cueing for posture corrections during sessions     PT LONG TERM GOAL #5   Title Patient to report 80% improvement in pain.    Time 6    Period Weeks    Status On-going   Pain has become more localized but has not gotten better in the localized areas.                Plan - 05/22/20 1011    Clinical Impression Statement Patient arrived to session with report of increased L shoulder pain on Thursday without known cause. Denies skin irritation other than redness from ionto patch. Today patient is pain-free at start of session. Worked on quadruped shoulder strengthening with modifications made to avoid wrist pain. Patient able to demonstrate good recruitment of deep neck flexors with cervical strengthening today, with limited compensations. Patient still noting most pain with overhead abduction, thus worked on this movement in sidelying with good control and tolerance. Provided cueing to maintain slight scapular retraction to improve shoulder alignment with multiple reps. Core and scapular stability was challenged with several exercises today with patient required manual and verbal cues to maintain neutral spine and avoid hip rotation with more dynamic exercises. Patient reported no pain at end of session. Spoke to patient about persisting L shoulder pain and suggested MD f/u for further assessment- patient agreeable.    Personal Factors and Comorbidities Age;Comorbidity 3+;Fitness;Past/Current Experience;Profession;Time since onset of injury/illness/exacerbation    Comorbidities HLD, shoulder arthroscopy with labral repair; ACL repair    PT Frequency Other (comment)    PT Duration 6 weeks    PT Treatment/Interventions ADLs/Self Care Home Management;Cryotherapy;Electrical  Stimulation;Iontophoresis 51m/ml Dexamethasone;Moist Heat;Traction;Therapeutic exercise;Therapeutic activities;Functional mobility training;Ultrasound;Neuromuscular re-education;Patient/family education;Manual techniques;Vasopneumatic Device;Taping;Energy conservation;Dry needling;Passive range of motion    PT Next Visit Plan cervical and shoulder stretching and postural strengthenigng    Consulted and Agree with Plan of Care Patient           Patient will benefit from skilled  therapeutic intervention in order to improve the following deficits and impairments:  Decreased activity tolerance,Pain,Impaired UE functional use,Increased fascial restricitons,Decreased strength,Increased muscle spasms,Improper body mechanics,Decreased range of motion,Impaired flexibility,Postural dysfunction  Visit Diagnosis: Chronic left shoulder pain  Cervicalgia  Other muscle spasm  Abnormal posture     Problem List Patient Active Problem List   Diagnosis Date Noted  . Symptoms consistent with irritable bowel syndrome 12/13/2017  . Chronic pain of both knees 12/13/2017  . Chronic bilateral low back pain without sciatica 12/13/2017     Janene Harvey, PT, DPT 05/22/20 10:14 AM   [facility] [place] [address]  [place], [place], [address] Phone: [phone]   Fax:  [phone]  Name: Johnathan Oneal MRN: [id number] Date of Birth: 1975/11/13

## 2020-05-28 ENCOUNTER — Other Ambulatory Visit: Payer: Self-pay

## 2020-05-28 ENCOUNTER — Encounter: Payer: Self-pay | Admitting: Physical Therapy

## 2020-05-28 ENCOUNTER — Ambulatory Visit: Payer: BC Managed Care – PPO | Attending: Osteopathic Medicine | Admitting: Physical Therapy

## 2020-05-28 DIAGNOSIS — G8929 Other chronic pain: Secondary | ICD-10-CM | POA: Insufficient documentation

## 2020-05-28 DIAGNOSIS — M62838 Other muscle spasm: Secondary | ICD-10-CM | POA: Diagnosis not present

## 2020-05-28 DIAGNOSIS — R293 Abnormal posture: Secondary | ICD-10-CM

## 2020-05-28 DIAGNOSIS — M25512 Pain in left shoulder: Secondary | ICD-10-CM | POA: Diagnosis not present

## 2020-05-28 DIAGNOSIS — M542 Cervicalgia: Secondary | ICD-10-CM | POA: Diagnosis not present

## 2020-05-28 NOTE — Therapy (Signed)
[facility] [place] [address]  [place], [place], [address] Phone: [phone]   Fax:  [phone]  Physical Therapy Discharge Summary  Patient Details  Name: Johnathan Oneal MRN: [id number] Date of Birth: Jul 04, 1975 Referring Provider (PT): Emeterio Reeve, DO   Progress Note Reporting Period 04/16/20 to 05/28/20  See note below for Objective Data and Assessment of Progress/Goals.     Encounter Date: 05/28/2020   PT End of Session - 05/28/20 0916    Visit Number 7    Number of Visits 13    Date for PT Re-Evaluation 05/28/20    Authorization Type BCBS    PT Start Time 0845    PT Stop Time 0914    PT Time Calculation (min) 29 min    Activity Tolerance Patient tolerated treatment well    Behavior During Therapy [facility] for tasks assessed/performed           Past Medical History:  Diagnosis Date  . High cholesterol     Past Surgical History:  Procedure Laterality Date  . ARTHROSCOPIC REPAIR ACL     Bilateral Patella Area  . SHOULDER ARTHROSCOPY W/ LABRAL REPAIR      There were no vitals filed for this visit.   Subjective Assessment - 05/28/20 0847    Subjective Shoulder has actually been feeling better but pulled something in the L side of his ribs when trying to catch a dog.    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 Yes    Multiple Pain Sites Yes    Pain Score 4    Pain Location Rib cage    Pain Orientation Left    Pain Descriptors / Indicators Constant    Pain Type Acute pain              [facility] PT Assessment - 05/28/20 0001      Assessment   Medical Diagnosis Neck pain, chronic L shoulder pain, cervical radiculopathy    Referring Provider (PT) Emeterio Reeve, DO    Onset  Date/Surgical Date 11/15/19      Observation/Other Assessments   Focus on Therapeutic Outcomes (FOTO)  Neck: 77      AROM   Left Shoulder Flexion 175 Degrees   stiffness   Left Shoulder ABduction 178 Degrees   "resistance"   Left Shoulder Internal Rotation --   FIR T6   Left Shoulder External Rotation --   FER T3   Cervical Flexion 55    Cervical Extension 55    Cervical - Right Side Bend 50    Cervical - Left Side Bend 57    Cervical - Right Rotation 66    Cervical - Left Rotation 70      Strength   Left Shoulder Flexion 4+/5    Left Shoulder ABduction 4+/5    Left Shoulder Internal Rotation 4+/5    Left Shoulder External Rotation 4+/5                         [facility] Adult PT Treatment/Exercise - 05/28/20 0001      Shoulder Exercises: ROM/Strengthening   UBE (Upper Arm Bike) L2.5 x 3 min forward/3 min back  PT Education - 05/28/20 0915    Education Details update/consolidation to HEP and edu on how to properly progress ther-ex; discussion on objective progress and remaining impairments    Person(s) Educated Patient    Methods Explanation;Demonstration;Tactile cues;Verbal cues;Handout    Comprehension Verbalized understanding            PT Short Term Goals - 05/28/20 0904      PT SHORT TERM GOAL #1   Title Patient to be independent with initial HEP.    Time 3    Period Weeks    Status Achieved    Target Date 05/07/20             PT Long Term Goals - 05/28/20 0904      PT LONG TERM GOAL #1   Title Patient to be independent with advanced HEP.    Time 6    Period Weeks    Status Achieved      PT LONG TERM GOAL #2   Title Patient to demonstrate L shoulder AROM WFL and without pain limiting.    Time 6    Period Weeks    Status Achieved   still pain with flexion and abduction into end range but AROM WFL     PT LONG TERM GOAL #3   Title Patient to demonstrate cervical AROM WFL and without pain limiting.    Time 6     Period Weeks    Status Achieved   Cervical AROM WFL except B side bends, also had tightness in both directions no pain with any motions     PT LONG TERM GOAL #4   Title Patient to recall and demonstrate postural correction at rest and with activity for carryover at work/home.    Time 6    Period Weeks    Status Partially Met   still demonstrates rounded shoulders and forward head posture- able to correct with cueing     PT LONG TERM GOAL #5   Title Patient to report 80% improvement in pain.    Time 6    Period Weeks    Status Partially Met   reporting 75% improvement in pain levels                Plan - 05/28/20 0916    Clinical Impression Statement Patient arrived to session with report of improved L shoulder pain recently. However, noting new onset of L lower ribcage pain after trying to pick up a dog leash from the floor on Sunday. Cervical AROM has improved in L sidebending and R rotation and all motions are now pain-free. L shoulder AROM has improved in flexion, abduction, and ER and is now also pain-free. L shoulder strength is normal. Overall, patient reports 75% improvement in pain levels. Still demonstrates rounded shoulders and forward head posture at rest, thus educated patient on importance of improved postural awareness and use of lumbar roll in sitting. Consolidated HEP to include most valuable exercises that patient has previously tolerated well. Patient reported understanding and without complaints at end fo session> patient has met most goals at this time and is ready for DC.    Personal Factors and Comorbidities Age;Comorbidity 3+;Fitness;Past/Current Experience;Profession;Time since onset of injury/illness/exacerbation    Comorbidities HLD, shoulder arthroscopy with labral repair; ACL repair    PT Frequency Other (comment)    PT Duration 6 weeks    PT Treatment/Interventions ADLs/Self Care Home Management;Cryotherapy;Electrical Stimulation;Iontophoresis 31m/ml  Dexamethasone;Moist Heat;Traction;Therapeutic exercise;Therapeutic activities;Functional mobility training;Ultrasound;Neuromuscular re-education;Patient/family education;Manual techniques;Vasopneumatic Device;Taping;Energy  conservation;Dry needling;Passive range of motion    PT Next Visit Plan DC at this time    Consulted and Agree with Plan of Care Patient           Patient will benefit from skilled therapeutic intervention in order to improve the following deficits and impairments:  Decreased activity tolerance,Pain,Impaired UE functional use,Increased fascial restricitons,Decreased strength,Increased muscle spasms,Improper body mechanics,Decreased range of motion,Impaired flexibility,Postural dysfunction  Visit Diagnosis: Chronic left shoulder pain  Cervicalgia  Other muscle spasm  Abnormal posture     Problem List Patient Active Problem List   Diagnosis Date Noted  . Symptoms consistent with irritable bowel syndrome 12/13/2017  . Chronic pain of both knees 12/13/2017  . Chronic bilateral low back pain without sciatica 12/13/2017    PHYSICAL THERAPY DISCHARGE SUMMARY  Visits from Start of Care: 7  Current functional level related to goals / functional outcomes: See above clinical impression   Remaining deficits: Rounded posture; occasional pain   Education / Equipment: HEP  Plan: Patient agrees to discharge.  Patient goals were partially met. Patient is being discharged due to being pleased with the current functional level.  ?????      Janene Harvey, PT, DPT 05/28/20 9:21 AM    [facility] [address]  [place], [place], [address] Phone: [phone]   Fax:  [phone]  Name: Johnathan Oneal MRN: [id number] Date of Birth: 06/13/1975

## 2020-10-29 ENCOUNTER — Other Ambulatory Visit: Payer: Self-pay | Admitting: Osteopathic Medicine

## 2021-04-11 ENCOUNTER — Emergency Department
Admission: EM | Admit: 2021-04-11 | Discharge: 2021-04-11 | Disposition: A | Discharge status: Home / Self Care | Payer: BC Managed Care – PPO | Source: Home / Self Care | Attending: Family Medicine | Admitting: Family Medicine

## 2021-04-11 ENCOUNTER — Encounter: Payer: Self-pay | Admitting: Emergency Medicine

## 2021-04-11 ENCOUNTER — Other Ambulatory Visit: Payer: Self-pay

## 2021-04-11 DIAGNOSIS — M7711 Lateral epicondylitis, right elbow: Secondary | ICD-10-CM | POA: Diagnosis not present

## 2021-04-11 MED ORDER — METHYLPREDNISOLONE 4 MG PO TBPK: ORAL_TABLET | ORAL | 0 refills | Status: DC

## 2021-04-11 NOTE — ED Triage Notes (Signed)
Woke up with right elbow pain w/ a yellow bruise about 3 weeks ago Denies injury  Pain with lifting since then  ROM is not limited Worse the last couple of days  Tylenol 625mg  daily- min relief

## 2021-04-11 NOTE — ED Provider Notes (Signed)
[name]    CSN: [id number] Arrival date & time: 04/11/21  [id number]      History   Chief Complaint Chief Complaint  Patient presents with   Elbow Pain    right    HPI Johnathan Oneal is a 46 y.o. male.   HPI  Patient is here for right elbow pain.  Started about 3 weeks ago.  He awoke 1 morning with elbow pain.  He noticed a faint discoloration looked like it might be a resolving bruise.  He recalls no trauma.  He does use his arm repetitively at work.  He is right-hand dominant.  No sports or hobby activities that would injure his elbow.  Past Medical History:  Diagnosis Date   High cholesterol     Patient Active Problem List   Diagnosis Date Noted   Symptoms consistent with irritable bowel syndrome 12/13/2017   Chronic pain of both knees 12/13/2017   Chronic bilateral low back pain without sciatica 12/13/2017    Past Surgical History:  Procedure Laterality Date   ARTHROSCOPIC REPAIR ACL     Bilateral Patella Area   SHOULDER ARTHROSCOPY W/ LABRAL REPAIR         Home Medications    Prior to Admission medications   Medication Sig Start Date End Date Taking? Authorizing Provider  methylPREDNISolone (MEDROL DOSEPAK) 4 MG TBPK tablet tad 04/11/21  Yes Raylene Everts, MD  meloxicam (MOBIC) 15 MG tablet Take 1 tablet (15 mg total) by mouth daily. 04/02/20   Emeterio Reeve, DO  pravastatin (PRAVACHOL) 40 MG tablet TAKE 1 TABLET(40 MG) BY MOUTH DAILY 10/29/20   Emeterio Reeve, DO    Family History Family History  Problem Relation Age of Onset   Breast cancer Mother    High blood pressure Father    Diabetes Father    Heart attack Maternal Grandfather    Canavan disease Paternal Grandmother    Cancer Paternal Grandmother    Diabetes Paternal Grandfather     Social History Social History   Tobacco Use   Smoking status: Never   Smokeless tobacco: Never  Vaping Use   Vaping Use: Never used  Substance Use Topics   Alcohol use: Not  Currently   Drug use: Never     Allergies   Penicillins   Review of Systems Review of Systems  See HPI Physical Exam Triage Vital Signs ED Triage Vitals  Enc Vitals Group     BP 04/11/21 0900 (!) 144/96     Pulse Rate 04/11/21 0900 69     Resp 04/11/21 0900 15     Temp 04/11/21 0900 98.8 F (37.1 C)     Temp Source 04/11/21 0900 Oral     SpO2 04/11/21 0900 98 %     Weight 04/11/21 0902 195 lb (88.5 kg)     Height 04/11/21 0902 6\' 1"  (1.854 m)     Head Circumference --      Peak Flow --      Pain Score 04/11/21 0901 2     Pain Loc --      Pain Edu? --      Excl. in [place]? --    No data found.  Updated Vital Signs BP (!) 144/96 (BP Location: Left Arm) Comment: elevated at MD visits  - okay at home per pt 117/75   Pulse 69    Temp 98.8 F (37.1 C) (Oral)    Resp 15    Ht 6\' 1"  (1.854 m)  Wt 88.5 kg    SpO2 98%    BMI 25.73 kg/m      Physical Exam Constitutional:      General: He is not in acute distress.    Appearance: He is well-developed and normal weight.  HENT:     Head: Normocephalic and atraumatic.     Mouth/Throat:     Comments: Mask in place Eyes:     Conjunctiva/sclera: Conjunctivae normal.     Pupils: Pupils are equal, round, and reactive to light.  Cardiovascular:     Rate and Rhythm: Normal rate.  Pulmonary:     Effort: Pulmonary effort is normal. No respiratory distress.  Abdominal:     General: There is no distension.     Palpations: Abdomen is soft.  Musculoskeletal:        General: Normal range of motion.     Right elbow: Normal.     Left elbow: Tenderness present in lateral epicondyle.     Cervical back: Normal range of motion.     Comments: Pain with resistance to wrist extension  Skin:    General: Skin is warm and dry.  Neurological:     Mental Status: He is alert.     UC Treatments / Results  Labs (all labs ordered are listed, but only abnormal results are displayed) Labs Reviewed - No data to display  EKG   Radiology No  results found.  Procedures Procedures (including critical care time)  Medications Ordered in UC Medications - No data to display  Initial Impression / Assessment and Plan / UC Course  I have reviewed the triage vital signs and the nursing notes.  Pertinent labs & imaging results that were available during my care of the patient were reviewed by me and considered in my medical decision making (see chart for details).     Final Clinical Impressions(s) / UC Diagnoses   Final diagnoses:  Lateral epicondylitis of right elbow     Discharge Instructions      Use ice for 20 minutes every 2-4 hours Take the Medrol Dosepak as directed.  Take all of day 1 today (3 now and then 3 Later) After the Medrol Dosepak you would take ibuprofen 600 mg 3 times a day, as needed for pain Avoid heavy use of arm elbow strap for wear at work See sport medicine if fails to improve    ED Prescriptions     Medication Sig Dispense Auth. Provider   methylPREDNISolone (MEDROL DOSEPAK) 4 MG TBPK tablet tad 21 tablet Raylene Everts, MD      PDMP not reviewed this encounter.   Raylene Everts, MD 04/11/21 1005

## 2021-04-11 NOTE — Discharge Instructions (Addendum)
Use ice for 20 minutes every 2-4 hours Take the Medrol Dosepak as directed.  Take all of day 1 today (3 now and then 3 Later) After the Medrol Dosepak you would take ibuprofen 600 mg 3 times a day, as needed for pain Avoid heavy use of arm elbow strap for wear at work See sport medicine if fails to improve

## 2021-04-14 ENCOUNTER — Encounter: Payer: Self-pay | Admitting: Family Medicine

## 2021-04-14 ENCOUNTER — Ambulatory Visit: Payer: BC Managed Care – PPO | Admitting: Family Medicine

## 2021-04-14 ENCOUNTER — Other Ambulatory Visit: Payer: Self-pay

## 2021-04-14 VITALS — BP 150/87 | HR 66 | Temp 98.4°F | Ht 73.0 in | Wt 191.0 lb

## 2021-04-14 DIAGNOSIS — Z0000 Encounter for general adult medical examination without abnormal findings: Secondary | ICD-10-CM | POA: Diagnosis not present

## 2021-04-14 DIAGNOSIS — Z23 Encounter for immunization: Secondary | ICD-10-CM | POA: Diagnosis not present

## 2021-04-14 DIAGNOSIS — Z13220 Encounter for screening for lipoid disorders: Secondary | ICD-10-CM

## 2021-04-14 LAB — CBC WITH DIFFERENTIAL/PLATELET
Absolute Monocytes: 637 cells/uL (ref 200–950)
Basophils Absolute: 27 cells/uL (ref 0–200)
Basophils Relative: 0.3 %
Eosinophils Absolute: 36 cells/uL (ref 15–500)
Eosinophils Relative: 0.4 %
HCT: 43.9 % (ref 38.5–50.0)
Hemoglobin: 15 g/dL (ref 13.2–17.1)
Lymphs Abs: 1957 cells/uL (ref 850–3900)
MCH: 30.7 pg (ref 27.0–33.0)
MCHC: 34.2 g/dL (ref 32.0–36.0)
MCV: 90 fL (ref 80.0–100.0)
MPV: 12.2 fL (ref 7.5–12.5)
Monocytes Relative: 7 %
Neutro Abs: 6443 cells/uL (ref 1500–7800)
Neutrophils Relative %: 70.8 %
Platelets: 240 10*3/uL (ref 140–400)
RBC: 4.88 10*6/uL (ref 4.20–5.80)
RDW: 12.3 % (ref 11.0–15.0)
Total Lymphocyte: 21.5 %
WBC: 9.1 10*3/uL (ref 3.8–10.8)

## 2021-04-14 LAB — COMPLETE METABOLIC PANEL WITH GFR
AG Ratio: 1.8 (calc) (ref 1.0–2.5)
ALT: 24 U/L (ref 9–46)
AST: 11 U/L (ref 10–40)
Albumin: 4.8 g/dL (ref 3.6–5.1)
Alkaline phosphatase (APISO): 86 U/L (ref 36–130)
BUN: 16 mg/dL (ref 7–25)
CO2: 30 mmol/L (ref 20–32)
Calcium: 9.6 mg/dL (ref 8.6–10.3)
Chloride: 103 mmol/L (ref 98–110)
Creat: 0.84 mg/dL (ref 0.60–1.29)
Globulin: 2.6 g/dL (calc) (ref 1.9–3.7)
Glucose, Bld: 89 mg/dL (ref 65–139)
Potassium: 4.2 mmol/L (ref 3.5–5.3)
Sodium: 141 mmol/L (ref 135–146)
Total Bilirubin: 0.5 mg/dL (ref 0.2–1.2)
Total Protein: 7.4 g/dL (ref 6.1–8.1)
eGFR: 110 mL/min/{1.73_m2} (ref >=60)

## 2021-04-14 LAB — LIPID PANEL W/REFLEX DIRECT LDL
Cholesterol: 205 mg/dL — ABNORMAL HIGH (ref <200)
HDL: 34 mg/dL — ABNORMAL LOW (ref >=40)
LDL Cholesterol (Calc): 138 mg/dL (calc) — ABNORMAL HIGH
Non-HDL Cholesterol (Calc): 171 mg/dL (calc) — ABNORMAL HIGH (ref <130)
Total CHOL/HDL Ratio: 6 (calc) — ABNORMAL HIGH (ref <5.0)
Triglycerides: 190 mg/dL — ABNORMAL HIGH (ref <150)

## 2021-04-14 NOTE — Progress Notes (Signed)
Johnathan Oneal - 46 y.o. male MRN [id number]  Date of birth: [birth year]  Subjective Chief Complaint  Patient presents with   Transitions Of Care    HPI Johnathan Oneal is a 46 year old male here today for annual exam.  Reports he is doing fairly well.  He is dealing with right-sided tennis elbow.  Recently started on steroids for this.  He does stay fairly active.  Feels like his diet is pretty healthy.  He is a non-smoker.  He consumes alcohol occasionally.  He would like to have updated Tdap and flu vaccine today.  Review of Systems  Constitutional:  Negative for chills, fever, malaise/fatigue and weight loss.  HENT:  Negative for congestion, ear pain and sore throat.   Eyes:  Negative for blurred vision, double vision and pain.  Respiratory:  Negative for cough and shortness of breath.   Cardiovascular:  Negative for chest pain and palpitations.  Gastrointestinal:  Negative for abdominal pain, blood in stool, constipation, heartburn and nausea.  Genitourinary:  Negative for dysuria and urgency.  Musculoskeletal:  Negative for joint pain and myalgias.  Neurological:  Negative for dizziness and headaches.  Endo/Heme/Allergies:  Does not bruise/bleed easily.  Psychiatric/Behavioral:  Negative for depression. The patient is not nervous/anxious and does not have insomnia.      Allergies  Allergen Reactions   Penicillins     As per pt - had a reaction as a child. Does not recall what type of reaction to med.     Past Medical History:  Diagnosis Date   High cholesterol     Past Surgical History:  Procedure Laterality Date   ARTHROSCOPIC REPAIR ACL     Bilateral Patella Area   SHOULDER ARTHROSCOPY W/ LABRAL REPAIR      Social History   Socioeconomic History   Marital status: Married    Spouse name: Not on file   Number of children: Not on file   Years of education: Not on file   Highest education level: Not on file  Occupational History   Occupation: Writer: [employer]  Tobacco Use   Smoking status: Never   Smokeless tobacco: Never  Vaping Use   Vaping Use: Never used  Substance and Sexual Activity   Alcohol use: Not Currently   Drug use: Never   Sexual activity: Yes    Partners: Female    Birth control/protection: Pill  Other Topics Concern   Not on file  Social History Narrative   Not on file   Social Determinants of Health   Financial Resource Strain: Not on file  Food Insecurity: Not on file  Transportation Needs: Not on file  Physical Activity: Not on file  Stress: Not on file  Social Connections: Not on file    Family History  Problem Relation Age of Onset   Breast cancer Mother    High blood pressure Father    Diabetes Father    Heart attack Maternal Grandfather    Canavan disease Paternal Grandmother    Cancer Paternal Grandmother    Diabetes Paternal Grandfather     Health Maintenance  Topic Date Due   HIV Screening  Never done   Hepatitis C Screening  Never done   COVID-19 Vaccine (4 - Booster for [place] series) 04/30/2021 (Originally 02/22/2020)   TETANUS/TDAP  04/15/2031   INFLUENZA VACCINE  Completed   HPV VACCINES  Aged Out     ----------------------------------------------------------------------------------------------------------------------------------------------------------------------------------------------------------------- Physical Exam BP (!) 150/87 (BP Location:  Left Arm, Patient Position: Sitting, Cuff Size: Normal)    Pulse 66    Temp 98.4 F (36.9 C)    Ht 6\' 1"  (1.854 m)    Wt 191 lb (86.6 kg)    SpO2 97%    BMI 25.20 kg/m   Physical Exam Constitutional:      General: He is not in acute distress. HENT:     Head: Normocephalic and atraumatic.     Right Ear: Tympanic membrane and external ear normal.     Left Ear: Tympanic membrane and external ear normal.  Eyes:     General: No scleral icterus. Neck:     Thyroid: No thyromegaly.  Cardiovascular:      Rate and Rhythm: Normal rate and regular rhythm.     Heart sounds: Normal heart sounds.  Pulmonary:     Effort: Pulmonary effort is normal.     Breath sounds: Normal breath sounds.  Abdominal:     General: Bowel sounds are normal. There is no distension.     Palpations: Abdomen is soft.     Tenderness: There is no abdominal tenderness. There is no guarding.  Musculoskeletal:     Cervical back: Normal range of motion.  Lymphadenopathy:     Cervical: No cervical adenopathy.  Skin:    General: Skin is warm and dry.     Findings: No rash.  Neurological:     Mental Status: He is alert and oriented to person, place, and time.     Cranial Nerves: No cranial nerve deficit.     Motor: No abnormal muscle tone.  Psychiatric:        Mood and Affect: Mood normal.        Behavior: Behavior normal.    ------------------------------------------------------------------------------------------------------------------------------------------------------------------------------------------------------------------- Assessment and Plan  Well adult exam Well adult Orders Placed This Encounter  Procedures   Flu Vaccine QUAD 6+ mos PF IM (Fluarix Quad PF)   Tdap vaccine greater than or equal to 7yo IM   COMPLETE METABOLIC PANEL WITH GFR   CBC with Differential   Lipid Panel w/reflex Direct LDL  Screenings: Per lab orders Immunizations: Tdap, flu vaccine Anticipatory guidance/risk factor reduction: Recommendations per AVS.   No orders of the defined types were placed in this encounter.   No follow-ups on file.    This visit occurred during the SARS-CoV-2 public health emergency.  Safety protocols were in place, including screening questions prior to the visit, additional usage of staff PPE, and extensive cleaning of exam room while observing appropriate contact time as indicated for disinfecting solutions.

## 2021-04-14 NOTE — Patient Instructions (Signed)

## 2021-04-14 NOTE — Assessment & Plan Note (Signed)
Well adult Orders Placed This Encounter  Procedures   Flu Vaccine QUAD 6+ mos PF IM (Fluarix Quad PF)   Tdap vaccine greater than or equal to 45yo IM   COMPLETE METABOLIC PANEL WITH GFR   CBC with Differential   Lipid Panel w/reflex Direct LDL  Screenings: Per lab orders Immunizations: Tdap, flu vaccine Anticipatory guidance/risk factor reduction: Recommendations per AVS.

## 2021-05-13 ENCOUNTER — Other Ambulatory Visit: Payer: Self-pay | Admitting: Osteopathic Medicine

## 2021-06-04 ENCOUNTER — Ambulatory Visit: Payer: BC Managed Care – PPO | Admitting: Family Medicine

## 2021-06-17 ENCOUNTER — Ambulatory Visit: Payer: BC Managed Care – PPO | Admitting: Family Medicine

## 2021-07-18 IMAGING — DX DG SHOULDER 2+V*L*
3 series · 3 of 3 positions shown · non-contrast
Comparison: None.

CLINICAL DATA: Chronic left shoulder pain.

EXAM:
LEFT SHOULDER - 2+ VIEW

[shoulder grashey]
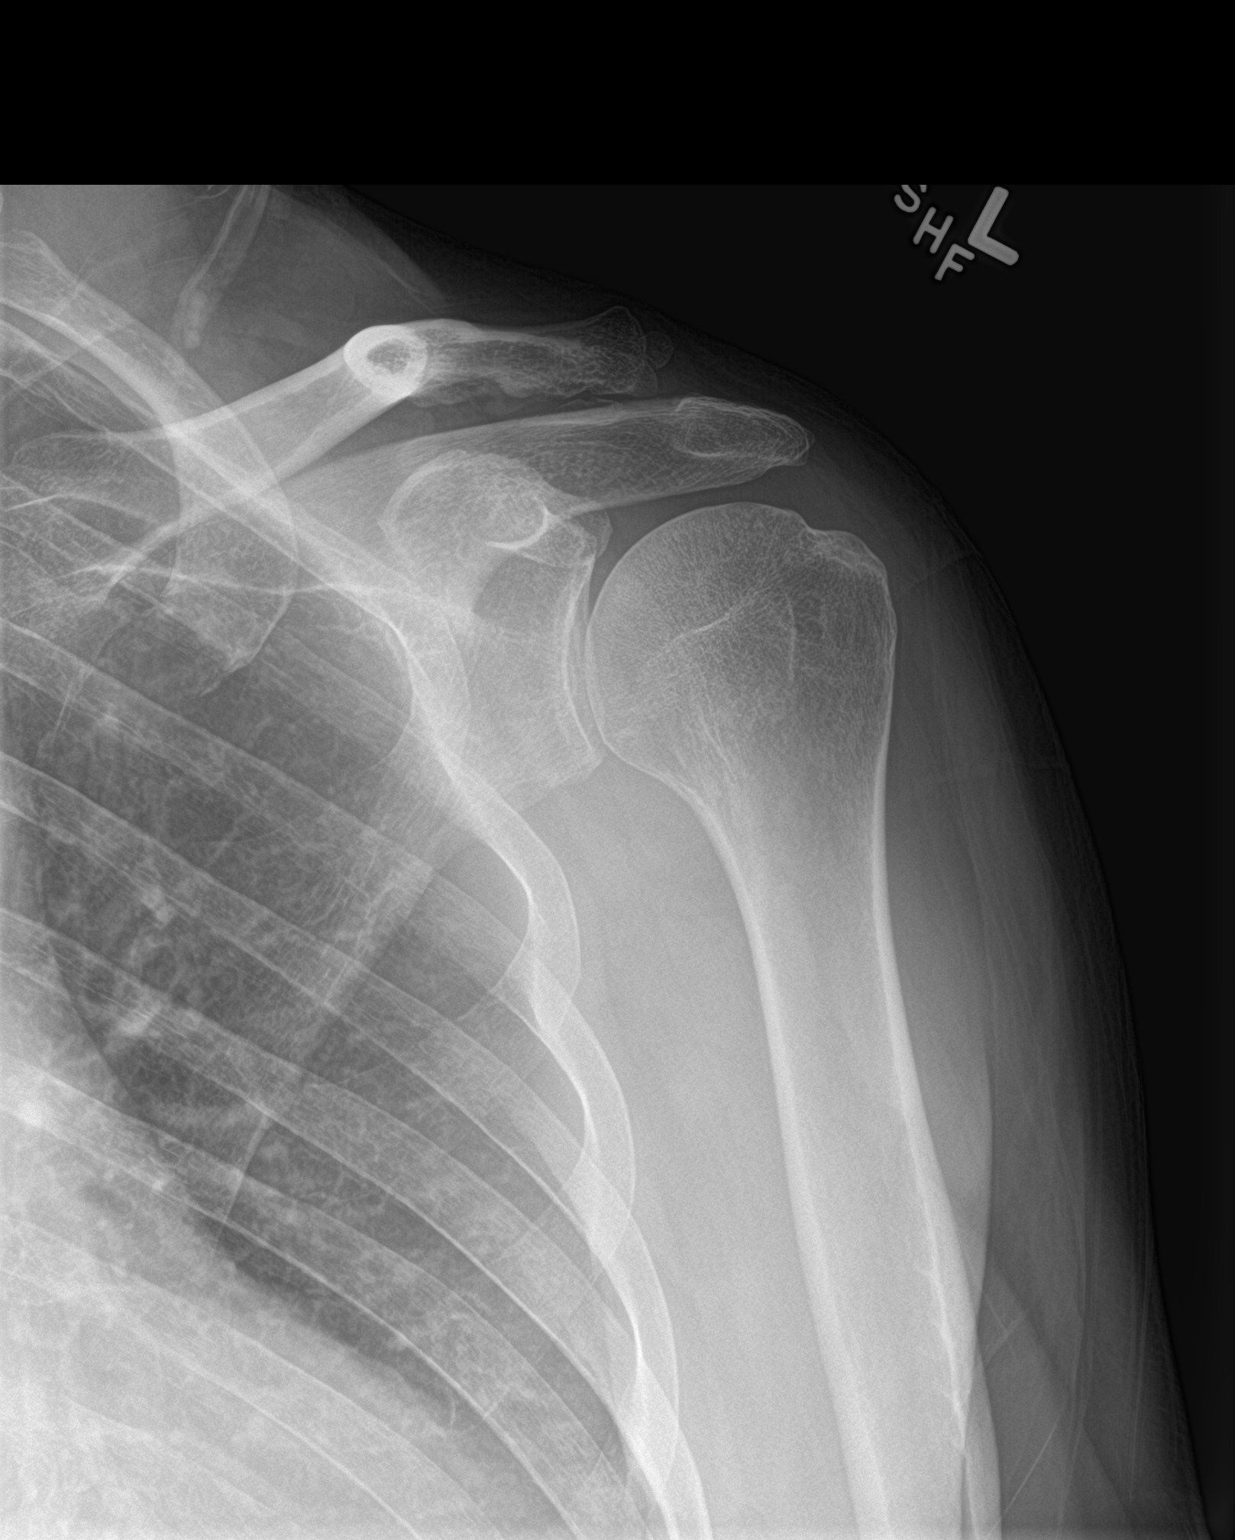

[shoulder y view]
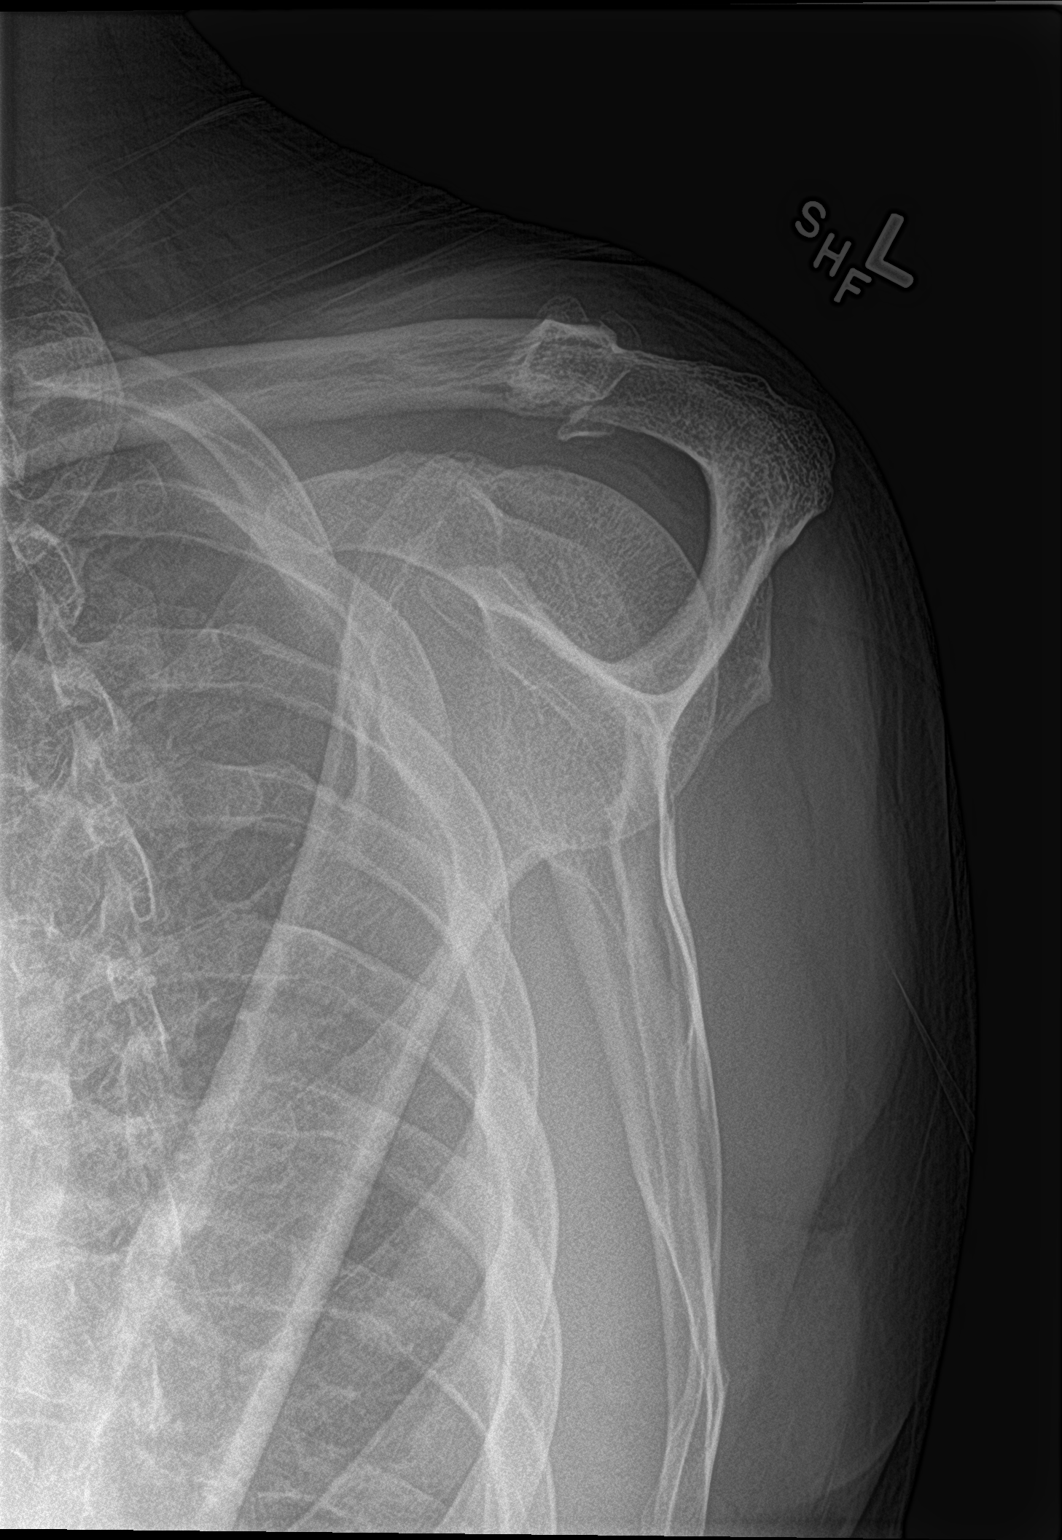

[shoulder axillary]
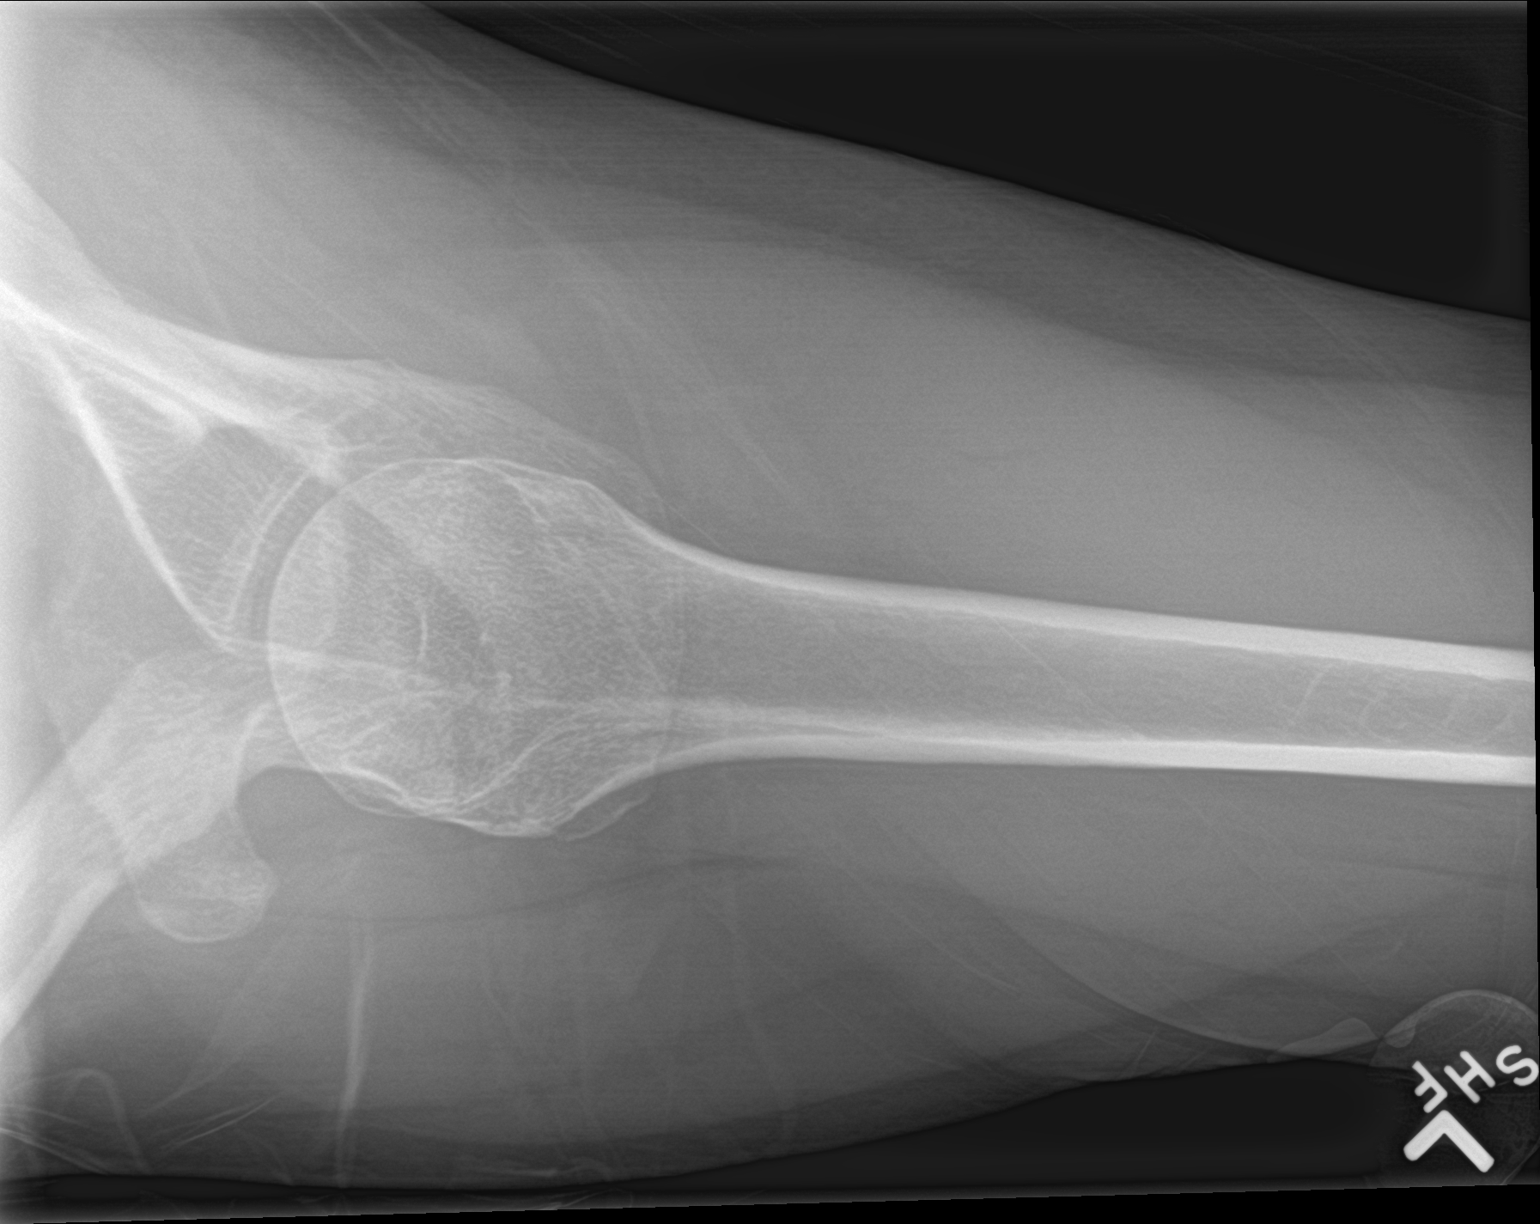

[3 of 3 positions shown; findings below may reference images not displayed]

FINDINGS: No acute fracture or dislocation. Chronic acromioclavicular joint
injury with mild elevation of the distal clavicle. The glenohumeral
joint space is preserved. Bone mineralization is normal. Soft
tissues are unremarkable.
IMPRESSION: 1. No acute osseous abnormality or significant degenerative changes.
2. Chronic acromioclavicular joint injury.

## 2021-11-20 ENCOUNTER — Other Ambulatory Visit: Payer: Self-pay

## 2021-11-20 ENCOUNTER — Ambulatory Visit (INDEPENDENT_AMBULATORY_CARE_PROVIDER_SITE_OTHER): Payer: BC Managed Care – PPO

## 2021-11-20 ENCOUNTER — Ambulatory Visit
Admission: EM | Admit: 2021-11-20 | Discharge: 2021-11-20 | Disposition: A | Discharge status: Home / Self Care | Payer: BC Managed Care – PPO | Attending: Physician Assistant | Admitting: Physician Assistant

## 2021-11-20 ENCOUNTER — Other Ambulatory Visit: Payer: Self-pay | Admitting: Family Medicine

## 2021-11-20 ENCOUNTER — Encounter: Payer: Self-pay | Admitting: Emergency Medicine

## 2021-11-20 DIAGNOSIS — M545 Low back pain, unspecified: Secondary | ICD-10-CM

## 2021-11-20 DIAGNOSIS — M79604 Pain in right leg: Secondary | ICD-10-CM

## 2021-11-20 DIAGNOSIS — M5137 Other intervertebral disc degeneration, lumbosacral region: Secondary | ICD-10-CM | POA: Diagnosis not present

## 2021-11-20 MED ORDER — PREDNISONE 10 MG PO TABS: ORAL_TABLET | ORAL | 0 refills | Status: DC

## 2021-11-20 MED ORDER — DICLOFENAC SODIUM 75 MG PO TBEC: 75.0000 mg | DELAYED_RELEASE_TABLET | Freq: Two times a day (BID) | ORAL | 0 refills | Status: DC

## 2021-11-20 MED ORDER — METHOCARBAMOL 750 MG PO TABS: 750.0000 mg | ORAL_TABLET | Freq: Four times a day (QID) | ORAL | 0 refills | Status: DC

## 2021-11-20 NOTE — ED Triage Notes (Signed)
Rt lower back pain x 1 month Now worse on lower left denies injury

## 2021-11-20 NOTE — Discharge Instructions (Addendum)
Your  Albesa Seen show some degenerative changes  Schedule to see Dr. Zigmund Daniel for recheck.

## 2021-11-21 NOTE — ED Provider Notes (Signed)
[facility]    CSN: [id number] Arrival date & time: 11/20/21  4270      History   Chief Complaint Chief Complaint  Patient presents with   Back Pain    HPI Johnathan Oneal is a 46 y.o. male.   Patient complains of pain in his low back for the past month.  Patient denies any injuries.  Patient denies any numbness or tingling  The history is provided by the patient. No language interpreter was used.  Back Pain Location:  Lumbar spine Quality:  Aching Radiates to:  Does not radiate Pain severity:  Moderate Pain is:  Same all the time Onset quality:  Gradual Ineffective treatments:  None tried Associated symptoms: no fever     Past Medical History:  Diagnosis Date   High cholesterol     Patient Active Problem List   Diagnosis Date Noted   Well adult exam 04/14/2021   Symptoms consistent with irritable bowel syndrome 12/13/2017   Chronic pain of both knees 12/13/2017   Chronic bilateral low back pain without sciatica 12/13/2017    Past Surgical History:  Procedure Laterality Date   ARTHROSCOPIC REPAIR ACL     Bilateral Patella Area   SHOULDER ARTHROSCOPY W/ LABRAL REPAIR         Home Medications    Prior to Admission medications   Medication Sig Start Date End Date Taking? Authorizing Provider  diclofenac (VOLTAREN) 75 MG EC tablet Take 1 tablet (75 mg total) by mouth 2 (two) times daily. 11/20/21  Yes Elson Areas, PA-C  methocarbamol (ROBAXIN-750) 750 MG tablet Take 1 tablet (750 mg total) by mouth 4 (four) times daily. 11/20/21  Yes Cheron Schaumann K, PA-C  predniSONE (DELTASONE) 10 MG tablet 6,5,4,3,2,1 taper 11/20/21  Yes Elson Areas, PA-C  pravastatin (PRAVACHOL) 40 MG tablet TAKE 1 TABLET(40 MG) BY MOUTH DAILY 11/20/21   Everrett Coombe, DO    Family History Family History  Problem Relation Age of Onset   Breast cancer Mother    High blood pressure Father    Diabetes Father    Heart attack Maternal Grandfather    Canavan disease  Paternal Grandmother    Cancer Paternal Grandmother    Diabetes Paternal Grandfather     Social History Social History   Tobacco Use   Smoking status: Never   Smokeless tobacco: Never  Vaping Use   Vaping Use: Never used  Substance Use Topics   Alcohol use: Yes   Drug use: Never     Allergies   Penicillins   Review of Systems Review of Systems  Constitutional:  Negative for fever.  Musculoskeletal:  Positive for back pain.  All other systems reviewed and are negative.    Physical Exam Triage Vital Signs ED Triage Vitals  Enc Vitals Group     BP 11/20/21 0838 (!) 148/95     Pulse Rate 11/20/21 0838 66     Resp 11/20/21 0838 16     Temp 11/20/21 0838 99.2 F (37.3 C)     Temp Source 11/20/21 0838 Oral     SpO2 11/20/21 0838 97 %     Weight 11/20/21 0839 190 lb (86.2 kg)     Height 11/20/21 0839 6\' 1"  (1.854 m)     Head Circumference --      Peak Flow --      Pain Score 11/20/21 0839 7     Pain Loc --      Pain Edu? --  Excl. in [place]? --    No data found.  Updated Vital Signs BP (!) 148/95 (BP Location: Left Arm)   Pulse 66   Temp 99.2 F (37.3 C) (Oral)   Resp 16   Ht 6\' 1"  (1.854 m)   Wt 86.2 kg   SpO2 97%   BMI 25.07 kg/m   Visual Acuity Right Eye Distance:   Left Eye Distance:   Bilateral Distance:    Right Eye Near:   Left Eye Near:    Bilateral Near:     Physical Exam Vitals and nursing note reviewed.  Constitutional:      Appearance: He is well-developed.  HENT:     Head: Normocephalic.  Cardiovascular:     Rate and Rhythm: Normal rate.  Pulmonary:     Effort: Pulmonary effort is normal.  Abdominal:     General: There is no distension.  Musculoskeletal:        General: Normal range of motion.     Cervical back: Normal range of motion.     Comments: Diffusely tender lumbar sacral spine  Skin:    General: Skin is warm.  Neurological:     General: No focal deficit present.     Mental Status: He is alert and oriented to  person, place, and time.  Psychiatric:        Mood and Affect: Mood normal.      UC Treatments / Results  Labs (all labs ordered are listed, but only abnormal results are displayed) Labs Reviewed - No data to display  EKG   Radiology DG Lumbar Spine Complete  Result Date: 11/20/2021 CLINICAL DATA:  Low back and right thigh pain. EXAM: LUMBAR SPINE - COMPLETE 4+ VIEW COMPARISON:  None Available. FINDINGS: There is no evidence of lumbar spine fracture. Alignment is normal. Intervertebral mild disc space narrowing at L5-S1. 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 Electronically Signed   By: Aletta Edouard M.D.   On: 11/20/2021 09:19    Procedures Procedures (including critical care time)  Medications Ordered in UC Medications - No data to display  Initial Impression / Assessment and Plan / UC Course  I have reviewed the triage vital signs and the nursing notes.  Pertinent labs & imaging results that were available during my care of the patient were reviewed by me and considered in my medical decision making (see chart for details).      Final Clinical Impressions(s) / UC Diagnoses   Final diagnoses:  Acute bilateral low back pain, unspecified whether sciatica present     Discharge Instructions      Your  Albesa Seen show some degenerative changes  Schedule to see Dr. Zigmund Daniel for recheck.     ED Prescriptions     Medication Sig Dispense Auth. Provider   predniSONE (DELTASONE) 10 MG tablet 6,5,4,3,2,1 taper 21 tablet Kataryna Mcquilkin K, PA-C   methocarbamol (ROBAXIN-750) 750 MG tablet Take 1 tablet (750 mg total) by mouth 4 (four) times daily. 20 tablet Ilhan Madan K, [place]   diclofenac (VOLTAREN) 75 MG EC tablet Take 1 tablet (75 mg total) by mouth 2 (two) times daily. 20 tablet Fransico Meadow, [place]      PDMP not reviewed this encounter.   Fransico Meadow, [place] 11/21/21 1831

## 2021-11-24 ENCOUNTER — Ambulatory Visit: Payer: BC Managed Care – PPO | Admitting: Family Medicine

## 2021-11-24 ENCOUNTER — Encounter: Payer: Self-pay | Admitting: Family Medicine

## 2021-11-24 VITALS — BP 147/80 | HR 68 | Ht 73.0 in | Wt 194.0 lb

## 2021-11-24 DIAGNOSIS — M5137 Other intervertebral disc degeneration, lumbosacral region: Secondary | ICD-10-CM | POA: Diagnosis not present

## 2021-11-24 DIAGNOSIS — Z23 Encounter for immunization: Secondary | ICD-10-CM

## 2021-11-24 NOTE — Assessment & Plan Note (Signed)
Symptoms improved.  Will add on physical therapy x6-8 weeks.  If not improving we can consider addition of MRI for interventional planning.

## 2021-11-24 NOTE — Patient Instructions (Signed)
Degenerative Disk Disease ? ?Degenerative disk disease is a condition caused by changes that occur in the spinal disks as a person ages. Spinal disks are soft and compressible disks located between the bones of your spine (vertebrae). These disks act like shock absorbers. ?Degenerative disk disease can affect the whole spine. However, the neck and lower back are most often affected. Many changes can occur in the spinal disks with aging, such as: ?The spinal disks may dry and shrink. ?Small tears may occur in the tough, outer covering of the disk (annulus). ?The disk space may become smaller due to loss of water. ?Abnormal growths in the bone (spurs) may occur. This can put pressure on the nerve roots exiting the spinal canal, causing pain. ?The spinal canal may become narrowed. ?What are the causes? ?This condition may be caused by: ?Normal degeneration with age. ?Injuries. ?Certain activities and sports that cause damage. ?What increases the risk? ?The following factors may make you more likely to develop this condition: ?Being overweight. ?Having a family history of degenerative disk disease. ?Smoking and use of products that contain nicotine and tobacco. ?Sudden injury. ?Doing work that requires heavy lifting. ?What are the signs or symptoms? ?Symptoms of this condition include: ?Pain that varies in intensity. Some people have no pain, while others have severe pain. The location of the pain depends on the part of your backbone that is affected. You may have: ?Pain in your neck or arm if a disk in your neck area is affected. ?Pain in your back, buttocks, or legs if a disk in your lower back is affected. ?Pain that becomes worse while bending or reaching up, or with twisting movements. ?Pain that may start gradually and worsen as time passes. It may also start after a major or minor injury. ?Numbness or tingling in the arms or legs. ?How is this diagnosed? ?This condition may be diagnosed based on: ?Your symptoms  and medical history. ?A physical exam. ?Imaging tests, including: ?X-ray of the spine. ?CT scan. ?MRI. ?How is this treated? ?This condition may be treated with: ?Medicines. ?Injection of steroids into the back. ?Rehabilitation exercises. These activities aim to strengthen muscles in your back and abdomen to better support your spine. ?If treatments do not help to relieve your symptoms or you have severe pain, you may need surgery. ?Follow these instructions at home: ?Medicines ?Take over-the-counter and prescription medicines only as told by your health care provider. ?Ask your health care provider if the medicine prescribed to you: ?Requires you to avoid driving or using machinery. ?Can cause constipation. You may need to take these actions to prevent or treat constipation: ?Drink enough fluid to keep your urine pale yellow. ?Take over-the-counter or prescription medicines. ?Eat foods that are high in fiber, such as beans, whole grains, and fresh fruits and vegetables. ?Limit foods that are high in fat and processed sugars, such as fried or sweet foods. ?Activity ?Rest as told by your health care provider. ?Avoid sitting for a long time without moving. Get up to take short walks every 1-2 hours. This is important to improve blood flow and breathing. Ask for help if you feel weak or unsteady. ?Return to your normal activities as told by your health care provider. Ask your health care provider what activities are safe for you. ?Perform relaxation exercises as told by your health care provider. ?Maintain good posture. ?Do not lift anything that is heavier than 10 lb (4.5 kg), or the limit that you are told, until your health   care provider says that it is safe. ?Follow proper lifting and walking techniques as told by your health care provider. ?Managing pain, stiffness, and swelling ? ?  ? ?If directed, put ice on the painful area. Icing can help to relieve pain. To do this: ?Put ice in a plastic bag. ?Place a towel  between your skin and the bag. ?Leave the ice on for 20 minutes, 2-3 times a day. ?Remove the ice if your skin turns bright red. This is very important. If you cannot feel pain, heat, or cold, you have a greater risk of damage to the area. ?If directed, apply heat to the painful area as often as told by your health care provider. Heat can reduce the stiffness of your muscles. Use the heat source that your health care provider recommends, such as a moist heat pack or a heating pad. ?Place a towel between your skin and the heat source. ?Leave the heat on for 20-30 minutes. ?Remove the heat if your skin turns bright red. This is especially important if you are unable to feel pain, heat, or cold. You may have a greater risk of getting burned. ?General instructions ?Change your sitting, standing, and sleeping habits as told by your health care provider. ?Avoid sitting in the same position for long periods of time. Change positions frequently. ?Lose weight or maintain a healthy weight as told by your health care provider. ?Do not use any products that contain nicotine or tobacco, such as cigarettes, e-cigarettes, and chewing tobacco. If you need help quitting, ask your health care provider. ?Wear supportive footwear. ?Keep all follow-up visits. This is important. This may include visits for physical therapy. ?Contact a health care provider if you: ?Have pain that does not go away within 1-4 weeks. ?Lose your appetite. ?Lose weight without trying. ?Get help right away if you: ?Have severe pain. ?Notice weakness in your arms, hands, or legs. ?Begin to lose control of your bladder or bowel movements. ?Have fevers or night sweats. ?Summary ?Degenerative disk disease is a condition caused by changes that occur in the spinal disks as a person ages. ?This condition can affect the whole spine. However, the neck and lower back are most often affected. ?Take over-the-counter and prescription medicines only as told by your health  care provider. ?This information is not intended to replace advice given to you by your health care provider. Make sure you discuss any questions you have with your health care provider. ?Document Revised: 05/24/2019 Document Reviewed: 05/24/2019 ?Elsevier Patient Education ? 2023 [employer] ? ?

## 2021-11-24 NOTE — Progress Notes (Signed)
Johnathan Oneal - 46 y.o. male MRN [id number]  Date of birth: 1976/01/26  Subjective Chief Complaint  Patient presents with   Back Pain    HPI Johnathan Oneal is a 46 y.o. male here today for follow up of recent urgent care visit.  Seen for back pain.  Initially started as right sided pain with radiation into anterior thigh a few weeks ago. Described this as burning pain.    Started having similar pain in L leg a few days ago and decided to go to urgent care. Prednisone taper as well as robaxin added. Reports that left sided pain had pretty much resolved by the next day.  R sided pain is back to baseline.  Xrays show lumbosacral DDD at L5-S1.  Denies bowel/bladder incontinence.    ROS:  A comprehensive ROS was completed and negative except as noted per HPI  Allergies  Allergen Reactions   Penicillins     As per pt - had a reaction as a child. Does not recall what type of reaction to med.     Past Medical History:  Diagnosis Date   High cholesterol     Past Surgical History:  Procedure Laterality Date   ARTHROSCOPIC REPAIR ACL     Bilateral Patella Area   SHOULDER ARTHROSCOPY W/ LABRAL REPAIR      Social History   Socioeconomic History   Marital status: Married    Spouse name: Not on file   Number of children: Not on file   Years of education: Not on file   Highest education level: Not on file  Occupational History   Occupation: Engineer, petroleum: [employer]  Tobacco Use   Smoking status: Never   Smokeless tobacco: Never  Vaping Use   Vaping Use: Never used  Substance and Sexual Activity   Alcohol use: Yes   Drug use: Never   Sexual activity: Yes    Partners: Female    Birth control/protection: Pill  Other Topics Concern   Not on file  Social History Narrative   Not on file   Social Determinants of Health   Financial Resource Strain: Not on file  Food Insecurity: Not on file  Transportation Needs: Not on file  Physical Activity: Not on  file  Stress: Not on file  Social Connections: Not on file    Family History  Problem Relation Age of Onset   Breast cancer Mother    High blood pressure Father    Diabetes Father    Heart attack Maternal Grandfather    Canavan disease Paternal Grandmother    Cancer Paternal Grandmother    Diabetes Paternal Grandfather     Health Maintenance  Topic Date Due   COVID-19 Vaccine (4 - [place] series) 03/25/2022 (Originally 02/22/2020)   Hepatitis C Screening  11/25/2022 (Originally 01/31/1995)   HIV Screening  11/25/2022 (Originally 01/31/1992)   TETANUS/TDAP  04/15/2031   INFLUENZA VACCINE  Completed   HPV VACCINES  Aged Out     ----------------------------------------------------------------------------------------------------------------------------------------------------------------------------------------------------------------- Physical Exam BP (!) 147/80 (BP Location: Left Arm, Patient Position: Sitting, Cuff Size: Normal)   Pulse 68   Ht 6\' 1"  (1.854 m)   Wt 194 lb (88 kg)   SpO2 99%   BMI 25.60 kg/m   Physical Exam Constitutional:      Appearance: Normal appearance.  Eyes:     General: No scleral icterus. Musculoskeletal:     Cervical back: Neck supple.     Comments: ROM is normal.  Some increased pain with extension.  Negative SLR bilaterally.  Strength is normal. Gait is normal.   Neurological:     Mental Status: He is alert.     ------------------------------------------------------------------------------------------------------------------------------------------------------------------------------------------------------------------- Assessment and Plan  DDD (degenerative disc disease), lumbosacral Symptoms improved.  Will add on physical therapy x6-8 weeks.  If not improving we can consider addition of MRI for interventional planning.     No orders of the defined types were placed in this encounter.   No follow-ups on file.    This visit  occurred during the SARS-CoV-2 public health emergency.  Safety protocols were in place, including screening questions prior to the visit, additional usage of staff PPE, and extensive cleaning of exam room while observing appropriate contact time as indicated for disinfecting solutions.

## 2021-11-30 ENCOUNTER — Ambulatory Visit: Payer: BC Managed Care – PPO | Admitting: Physical Therapy

## 2021-12-08 ENCOUNTER — Ambulatory Visit: Payer: BC Managed Care – PPO | Attending: Family Medicine | Admitting: Physical Therapy

## 2021-12-08 ENCOUNTER — Other Ambulatory Visit: Payer: Self-pay

## 2021-12-08 DIAGNOSIS — M5441 Lumbago with sciatica, right side: Secondary | ICD-10-CM | POA: Diagnosis not present

## 2021-12-08 DIAGNOSIS — M5137 Other intervertebral disc degeneration, lumbosacral region: Secondary | ICD-10-CM | POA: Insufficient documentation

## 2021-12-08 DIAGNOSIS — R293 Abnormal posture: Secondary | ICD-10-CM | POA: Diagnosis not present

## 2021-12-08 DIAGNOSIS — M5459 Other low back pain: Secondary | ICD-10-CM

## 2021-12-08 DIAGNOSIS — R2689 Other abnormalities of gait and mobility: Secondary | ICD-10-CM

## 2021-12-08 NOTE — Therapy (Signed)
OUTPATIENT PHYSICAL THERAPY THORACOLUMBAR EVALUATION   Patient Name: Johnathan Oneal MRN: [id number] DOB:09-10-1975, 46 y.o., male Today's Date: 12/08/2021   PT End of Session - 12/08/21 0804     Visit Number 1    Number of Visits 6    Date for PT Re-Evaluation 01/19/22    Authorization Type [employer]    PT Start Time 0801    PT Stop Time 0845    PT Time Calculation (min) 44 min    Activity Tolerance Patient tolerated treatment well    Behavior During Therapy [facility] for tasks assessed/performed             Past Medical History:  Diagnosis Date   High cholesterol    Past Surgical History:  Procedure Laterality Date   ARTHROSCOPIC REPAIR ACL     Bilateral Patella Area   SHOULDER ARTHROSCOPY W/ LABRAL REPAIR     Patient Active Problem List   Diagnosis Date Noted   DDD (degenerative disc disease), lumbosacral 11/24/2021   Well adult exam 04/14/2021   Symptoms consistent with irritable bowel syndrome 12/13/2017   Chronic pain of both knees 12/13/2017   Chronic bilateral low back pain without sciatica 12/13/2017    PCP: n/a  REFERRING PROVIDER: Everrett Coombe  REFERRING DIAG: M51.37 (ICD-10-CM) - DDD (degenerative disc disease), lumbosacral  Rationale for Evaluation and Treatment Rehabilitation  THERAPY DIAG:  Bilateral low back pain with right-sided sciatica, unspecified chronicity  Other low back pain  Abnormal posture  Other abnormalities of gait and mobility  ONSET DATE: A couple of months  SUBJECTIVE:                                                                                                                                                                                           SUBJECTIVE STATEMENT: Pt reports back pain throughout the day. Pt does not note anything specific that causes flare up. Does note increased pain with walking and squatting. Medication did help with his pain. Pt reports insidious onset. Pt feels that it has been slowly getting to  this point. Normally pain is contained to above R hip but now can feel it lower L side.   PERTINENT HISTORY:  N/a  PAIN:  Are you having pain? Yes: NPRS scale: 7 at worst, 4 currently/10 Pain location: Above R hip, lower L back Pain description: Throbbing; N/T is constant Aggravating factors: Walking, squatting, mostly feels N/T while driving Relieving factors: pain medication, ice   PRECAUTIONS: None  WEIGHT BEARING RESTRICTIONS: No  FALLS:  Has patient fallen in last 6 months? 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 sitting  PLOF: Independent  PATIENT GOALS: Improve pain   OBJECTIVE:   DIAGNOSTIC FINDINGS:  X-ray demos DDD  PATIENT SURVEYS:  FOTO 48; predicted 9  SCREENING FOR RED FLAGS: Bowel or bladder incontinence: No Spinal tumors: No Cauda equina syndrome: No Compression fracture: No Abdominal aneurysm: No  COGNITION:  Overall cognitive status: Within functional limits for tasks assessed     SENSATION: Reports N/T in back of R hip, can go all the way to front of thigh; N/T in left at times can be sharp to top of knee  MUSCLE LENGTH: Hamstrings: Right 60 deg; Left 60 deg Thomas test: Right 10 deg; Left 10 deg  POSTURE: L iliac crest slightly higher than R  PALPATION: TTP bilat glute med (R>L); spring testing WFL; TTP L>R PSIS  LUMBAR ROM:   AROM eval  Flexion 80%  Extension 80% feels it in spine  Right lateral flexion 100%  Left lateral flexion 100%  Right rotation 100%  Left rotation 100%   (Blank rows = not tested)   LOWER EXTREMITY MMT:    MMT Right eval Left eval  Hip flexion 4 5  Hip extension 5 5  Hip abduction 4+ 4+  Hip adduction    Hip internal rotation    Hip external rotation    Knee flexion 4 4  Knee extension 5 5  Ankle  dorsiflexion    Ankle plantarflexion    Ankle inversion    Ankle eversion     (Blank rows = not tested)  LUMBAR SPECIAL TESTS:  Straight leg raise test: Positive, FABER test: Negative, and Long sit test: NegativeSlump test Positive bilat   GAIT: Distance walked: 100' Assistive device utilized: None Level of assistance: Complete Independence Comments: Mild R trunk lean during R stance phase, increased L weightshift    TODAY'S TREATMENT:  [facility] Adult PT Treatment:                                                                                                                            DATE: 12/08/21 See HEP    PATIENT EDUCATION:  Education details: Exam findings, POC, HEP. Discussed sitting position in company car Person educated: Patient Education method: Explanation, Demonstration, and Handouts Education comprehension: verbalized understanding, returned demonstration, and needs further education   HOME EXERCISE PROGRAM: Access Code: [email or web address] URL: [email or web address] Date: 12/08/2021 Prepared by: Vernon Prey April Kirstie Peri  Exercises - Seated Figure 4 Piriformis Stretch  - 1 x daily - 7 x weekly - 2 sets - 30 sec hold - Seated Piriformis Stretch  - 1 x daily - 7 x weekly - 2 sets - 30 sec hold - Seated Hamstring Stretch  - 1 x daily - 7 x weekly - 2 sets - 30 sec hold - Seated Slump Nerve Glide  - 1 x daily -  7 x weekly - 1 sets - 10 reps  ASSESSMENT:  CLINICAL IMPRESSION: Patient is a 46 y.o. M who was seen today for physical therapy evaluation and treatment for subacute back pain. Assessment significant for lumbopelvic and sacral hypomobility, muscle trigger points, and increased bilat LE neural tension affecting functional mobility, t/fs, and amb. Pt would benefit from PT to address these deficits to maximize his level of function.    OBJECTIVE IMPAIRMENTS: Abnormal gait, decreased activity tolerance, decreased mobility, difficulty walking, decreased  strength, hypomobility, increased fascial restrictions, increased muscle spasms, impaired flexibility, improper body mechanics, postural dysfunction, and pain.   ACTIVITY LIMITATIONS: sitting, standing, transfers, and locomotion level  PARTICIPATION LIMITATIONS: community activity, occupation, and yard work  PERSONAL FACTORS: Age, Profession, and Time since onset of injury/illness/exacerbation are also affecting patient's functional outcome.   REHAB POTENTIAL: Good  CLINICAL DECISION MAKING: Stable/uncomplicated  EVALUATION COMPLEXITY: Low   GOALS: Goals reviewed with patient? Yes   LONG TERM GOALS: Target date: 01/19/2022  Pt will be ind with initial HEP Baseline:  Goal status: INITIAL  2.  Pt will have (-) slump test to demo improved neural tension Baseline:  Goal status: INITIAL  3.  Pt will report >/=50% improvement of pain with all activities Baseline:  Goal status: INITIAL  4.  Pt will report >/=50% resolution of N/T with all activities Baseline:  Goal status: INITIAL  5.  FOTO score will increase to >/=68 Baseline:  Goal status: INITIAL   PLAN: PT FREQUENCY: 1x/week  PT DURATION: 6 weeks  PLANNED INTERVENTIONS: Therapeutic exercises, Therapeutic activity, Neuromuscular re-education, Balance training, Gait training, Patient/Family education, Self Care, Joint mobilization, Stair training, DME instructions, Aquatic Therapy, Dry Needling, Electrical stimulation, Spinal mobilization, Cryotherapy, Moist heat, Taping, Vasopneumatic device, Traction, Ionotophoresis 4mg /ml Dexamethasone, Manual therapy, and Re-evaluation.  PLAN FOR NEXT SESSION: Assess response to HEP. Initiate strengthening regimen for core, hips, pelvis. Review body mechanics/posture with activities   Orson Rho April Ma L Atherton, PT, DPT 12/08/2021, 10:53 AM

## 2021-12-14 NOTE — Therapy (Signed)
OUTPATIENT PHYSICAL THERAPY TREATMENT NOTE   Patient Name: Johnathan Oneal MRN: [id number] DOB:02/01/1976, 46 y.o., male Today's Date: 12/15/2021  PCP: Everrett Coombe REFERRING PROVIDER: Everrett Coombe  END OF SESSION:   PT End of Session - 12/15/21 0842     Visit Number 2    Number of Visits 6    Date for PT Re-Evaluation 01/19/22    Authorization Type BC[employer]    PT Start Time [phone]    PT Stop Time 0923    PT Time Calculation (min) 40 min    Activity Tolerance Patient tolerated treatment well    Behavior During Therapy [facility] for tasks assessed/performed             Past Medical History:  Diagnosis Date   High cholesterol    Past Surgical History:  Procedure Laterality Date   ARTHROSCOPIC REPAIR ACL     Bilateral Patella Area   SHOULDER ARTHROSCOPY W/ LABRAL REPAIR     Patient Active Problem List   Diagnosis Date Noted   DDD (degenerative disc disease), lumbosacral 11/24/2021   Well adult exam 04/14/2021   Symptoms consistent with irritable bowel syndrome 12/13/2017   Chronic pain of both knees 12/13/2017   Chronic bilateral low back pain without sciatica 12/13/2017    REFERRING DIAG: M51.37 (ICD-10-CM) - DDD (degenerative disc disease), lumbosacral  THERAPY DIAG:  Bilateral low back pain with right-sided sciatica, unspecified chronicity  Other low back pain  Abnormal posture  Other abnormalities of gait and mobility  Rationale for Evaluation and Treatment Rehabilitation  PERTINENT HISTORY: n/a  PRECAUTIONS: none  SUBJECTIVE:                                                                                                                                                                                      SUBJECTIVE STATEMENT:   Pt arrives without significant pain, states HEP has been doing well. No new updates, states he seems to be feeling pretty good since initial eval.   PAIN:  Are you having pain? Yes: NPRS scale: 2/10 Pain location: low back,  mild RLE numbness Pain description: throbbing, N?T Aggravating factors: Walking, squatting, mostly feels N/T while driving Relieving factors: pain medication, ice   OBJECTIVE: (objective measures completed at initial evaluation unless otherwise dated)   DIAGNOSTIC FINDINGS:  X-ray demos DDD   PATIENT SURVEYS:  FOTO 48; predicted 72   SCREENING FOR RED FLAGS: Bowel or bladder incontinence: No Spinal tumors: No Cauda equina syndrome: No Compression fracture: No Abdominal aneurysm: No   COGNITION:           Overall cognitive status: Within functional limits for tasks assessed  SENSATION: Reports N/T in back of R hip, can go all the way to front of thigh; N/T in left at times can be sharp to top of knee   MUSCLE LENGTH: Hamstrings: Right 60 deg; Left 60 deg Thomas test: Right 10 deg; Left 10 deg   POSTURE: L iliac crest slightly higher than R   PALPATION: TTP bilat glute med (R>L); spring testing WFL; TTP L>R PSIS   LUMBAR ROM:    AROM eval  Flexion 80%  Extension 80% feels it in spine  Right lateral flexion 100%  Left lateral flexion 100%  Right rotation 100%  Left rotation 100%   (Blank rows = not tested)     LOWER EXTREMITY MMT:     MMT Right eval Left eval  Hip flexion 4 5  Hip extension 5 5  Hip abduction 4+ 4+  Hip adduction      Hip internal rotation      Hip external rotation      Knee flexion 4 4  Knee extension 5 5  Ankle dorsiflexion      Ankle plantarflexion      Ankle inversion      Ankle eversion       (Blank rows = not tested)   LUMBAR SPECIAL TESTS:  Straight leg raise test: Positive, FABER test: Negative, and Long sit test: NegativeSlump test Positive bilat     GAIT: Distance walked: 100' Assistive device utilized: None Level of assistance: Complete Independence Comments: Mild R trunk lean during R stance phase, increased L weightshift       TODAY'S TREATMENT:  [facility] Adult PT Treatment:                                 DATE: 12/15/21 Therapeutic Exercise: Seated figure 4 piriformis 2x30sec B  Seated piriformis stretch 2x30sec B Hamstring stretch seated 2x30sec B (second set with strap), cues for setup Sciatic nerve glides 2x10 B LE, cues for form and pacing Hooklying LTR x10 B, cues for ROM and pacing  Neuromuscular re-ed: Swiss ball press down 3 way (fwd and lateral) x12 each, for improved core activation, cues for breath control, posture, and core activation Suitcase carry 10# 3x1 lap each UE for improved unilateral core activation/stability, cues for posture and breath control Standing shoulder ext  BTB 3x10 for improved core/periscapular control, cues for upright posture and breath control Hinge at counter x12, for improved posterior chain activation and core stability. Cues for hinge mechanics and appropriate ROM      PATIENT EDUCATION:  Education details: rationale for interventions, PT POC Person educated: Patient Education method: Explanation, Demonstration, and Handouts Education comprehension: verbalized understanding, returned demonstration, and needs further education     HOME EXERCISE PROGRAM: Access Code: [email or web address] URL: [email or web address] Date: 12/08/2021 Prepared by: Estill Bamberg April Thurnell Garbe   Exercises - Seated Figure 4 Piriformis Stretch  - 1 x daily - 7 x weekly - 2 sets - 30 sec hold - Seated Piriformis Stretch  - 1 x daily - 7 x weekly - 2 sets - 30 sec hold - Seated Hamstring Stretch  - 1 x daily - 7 x weekly - 2 sets - 30 sec hold - Seated Slump Nerve Glide  - 1 x daily - 7 x weekly - 1 sets - 10 reps   ASSESSMENT:   CLINICAL IMPRESSION: Patient is a 45 y.o. gentleman who arrives to PT session for treatment of subacute  back pain. Pt arrives with report of improved pain since initial eval, good HEP compliance. Initiated session with HEP review, good recall and performance with min cues. Followed with additional mobility work for hips/lumbar  spine and addition of core stability/activation exercises. Pt tolerates session well overall - reports gradual improvement in pain as session goes on until performance of hinges at counter, during which pt reports initial increase in low back discomfort that improves with cues for ROM. Pt reports baseline pain on departure and improved numbness compared to arrival, no adverse events noted. Pt departs today's session in no acute distress, all voiced questions/concerns addressed appropriately from PT perspective.      OBJECTIVE IMPAIRMENTS: Abnormal gait, decreased activity tolerance, decreased mobility, difficulty walking, decreased strength, hypomobility, increased fascial restrictions, increased muscle spasms, impaired flexibility, improper body mechanics, postural dysfunction, and pain.    ACTIVITY LIMITATIONS: sitting, standing, transfers, and locomotion level   PARTICIPATION LIMITATIONS: community activity, occupation, and yard work   PERSONAL FACTORS: Age, Profession, and Time since onset of injury/illness/exacerbation are also affecting patient's functional outcome.    REHAB POTENTIAL: Good   CLINICAL DECISION MAKING: Stable/uncomplicated   EVALUATION COMPLEXITY: Low     GOALS: Goals reviewed with patient? Yes     LONG TERM GOALS: Target date: 01/19/2022   Pt will be ind with initial HEP Baseline:  Goal status: INITIAL   2.  Pt will have (-) slump test to demo improved neural tension Baseline:  Goal status: INITIAL   3.  Pt will report >/=50% improvement of pain with all activities Baseline:  Goal status: INITIAL   4.  Pt will report >/=50% resolution of N/T with all activities Baseline:  Goal status: INITIAL   5.  FOTO score will increase to >/=68 Baseline:  Goal status: INITIAL     PLAN: PT FREQUENCY: 1x/week   PT DURATION: 6 weeks   PLANNED INTERVENTIONS: Therapeutic exercises, Therapeutic activity, Neuromuscular re-education, Balance training, Gait  training, Patient/Family education, Self Care, Joint mobilization, Stair training, DME instructions, Aquatic Therapy, Dry Needling, Electrical stimulation, Spinal mobilization, Cryotherapy, Moist heat, Taping, Vasopneumatic device, Traction, Ionotophoresis 4mg /ml Dexamethasone, Manual therapy, and Re-evaluation.   PLAN FOR NEXT SESSION: Progress core stability and lumbar/hip mobility exercises as able/appropriate.     PT, DPT 12/15/2021 9:27 AM

## 2021-12-15 ENCOUNTER — Encounter: Payer: Self-pay | Admitting: Physical Therapy

## 2021-12-15 ENCOUNTER — Ambulatory Visit: Payer: BC Managed Care – PPO | Admitting: Physical Therapy

## 2021-12-15 DIAGNOSIS — R2689 Other abnormalities of gait and mobility: Secondary | ICD-10-CM | POA: Diagnosis not present

## 2021-12-15 DIAGNOSIS — M5137 Other intervertebral disc degeneration, lumbosacral region: Secondary | ICD-10-CM | POA: Diagnosis not present

## 2021-12-15 DIAGNOSIS — M5441 Lumbago with sciatica, right side: Secondary | ICD-10-CM | POA: Diagnosis not present

## 2021-12-15 DIAGNOSIS — M5459 Other low back pain: Secondary | ICD-10-CM

## 2021-12-15 DIAGNOSIS — R293 Abnormal posture: Secondary | ICD-10-CM | POA: Diagnosis not present

## 2021-12-23 ENCOUNTER — Ambulatory Visit: Payer: BC Managed Care – PPO | Attending: Family Medicine | Admitting: Rehabilitative and Restorative Service Providers"

## 2021-12-23 ENCOUNTER — Encounter: Payer: Self-pay | Admitting: Rehabilitative and Restorative Service Providers"

## 2021-12-23 DIAGNOSIS — R2689 Other abnormalities of gait and mobility: Secondary | ICD-10-CM | POA: Insufficient documentation

## 2021-12-23 DIAGNOSIS — M5459 Other low back pain: Secondary | ICD-10-CM | POA: Insufficient documentation

## 2021-12-23 DIAGNOSIS — R293 Abnormal posture: Secondary | ICD-10-CM | POA: Insufficient documentation

## 2021-12-23 DIAGNOSIS — M5441 Lumbago with sciatica, right side: Secondary | ICD-10-CM | POA: Insufficient documentation

## 2021-12-23 NOTE — Therapy (Signed)
OUTPATIENT PHYSICAL THERAPY TREATMENT NOTE   Patient Name: Johnathan Oneal MRN: [id number] DOB:1976/02/03, 46 y.o., male Today's Date: 12/23/2021  PCP: Luetta Nutting REFERRING PROVIDER: Luetta Nutting  END OF SESSION:   PT End of Session - 12/23/21 0710     Visit Number 3    Number of Visits 6    Date for PT Re-Evaluation 01/19/22    Authorization Type [employer]BS    PT Start Time 0710    PT Stop Time 0755    PT Time Calculation (min) 45 min    Activity Tolerance Patient tolerated treatment well             Past Medical History:  Diagnosis Date   High cholesterol    Past Surgical History:  Procedure Laterality Date   ARTHROSCOPIC REPAIR ACL     Bilateral Patella Area   SHOULDER ARTHROSCOPY W/ LABRAL REPAIR     Patient Active Problem List   Diagnosis Date Noted   DDD (degenerative disc disease), lumbosacral 11/24/2021   Well adult exam 04/14/2021   Symptoms consistent with irritable bowel syndrome 12/13/2017   Chronic pain of both knees 12/13/2017   Chronic bilateral low back pain without sciatica 12/13/2017    REFERRING DIAG: M51.37 (ICD-10-CM) - DDD (degenerative disc disease), lumbosacral  THERAPY DIAG:  Bilateral low back pain with right-sided sciatica, unspecified chronicity  Other low back pain  Abnormal posture  Other abnormalities of gait and mobility  Rationale for Evaluation and Treatment Rehabilitation  PERTINENT HISTORY: n/a  PRECAUTIONS: none  SUBJECTIVE:                                                                                                                                                                                      SUBJECTIVE STATEMENT:   Patient reports that he is having less painin the LB. He has some continued aching Rt > Lt in the low back area. Had some radiating pain into the Rt LE when driving yesterday morning. Resolved with walking. Working on his exercises at home.    PAIN:  Are you having pain? Yes: NPRS scale:  0/10 Pain location: low back, mild RLE numbness Pain description: throbbing, N?T Aggravating factors: Walking, squatting, mostly feels N/T while driving Relieving factors: pain medication, ice   OBJECTIVE: (objective measures completed at initial evaluation unless otherwise dated)   DIAGNOSTIC FINDINGS:  X-ray demos DDD   PATIENT SURVEYS:  FOTO 48; predicted 52   SCREENING FOR RED FLAGS: Bowel or bladder incontinence: No Spinal tumors: No Cauda equina syndrome: No Compression fracture: No Abdominal aneurysm: No   COGNITION:           Overall cognitive status:  Within functional limits for tasks assessed                          SENSATION: Reports N/T in back of R hip, can go all the way to front of thigh; N/T in left at times can be sharp to top of knee   MUSCLE LENGTH: Hamstrings: Right 60 deg; Left 60 deg Thomas test: Right 10 deg; Left 10 deg   POSTURE: L iliac crest slightly higher than R   PALPATION: TTP bilat glute med (R>L); spring testing WFL; TTP L>R PSIS   LUMBAR ROM:    AROM eval  Flexion 80%  Extension 80% feels it in spine  Right lateral flexion 100%  Left lateral flexion 100%  Right rotation 100%  Left rotation 100%   (Blank rows = not tested)     LOWER EXTREMITY MMT:     MMT Right eval Left eval  Hip flexion 4 5  Hip extension 5 5  Hip abduction 4+ 4+  Hip adduction      Hip internal rotation      Hip external rotation      Knee flexion 4 4  Knee extension 5 5  Ankle dorsiflexion      Ankle plantarflexion      Ankle inversion      Ankle eversion       (Blank rows = not tested)   LUMBAR SPECIAL TESTS:  Straight leg raise test: Positive, FABER test: Negative, and Long sit test: NegativeSlump test Positive bilat     GAIT: Distance walked: 100' Assistive device utilized: None Level of assistance: Complete Independence Comments: Mild R trunk lean during R stance phase, increased L weightshift       TODAY'S TREATMENT: 12/23/21:   Treadmill 2.2 mph x 5 min  Piriformis stretch supine travell 30 sec x 3  Supine hamstring stretch 30 sec x 3  Nerve stretch supine x 10 each LE  Transverse abdominals 10 sec x 10  Sit to stand core engaged hip hinge 10  Antirotation standing green TB x 10 ea side   Education re-sitting use of noodle in sitting; standing core engaged, importance of core strengthening/stabilization    [facility] Adult PT Treatment:                                DATE: 12/15/21 Therapeutic Exercise: Seated figure 4 piriformis 2x30sec B  Seated piriformis stretch 2x30sec B Hamstring stretch seated 2x30sec B (second set with strap), cues for setup Sciatic nerve glides 2x10 B LE, cues for form and pacing Hooklying LTR x10 B, cues for ROM and pacing  Neuromuscular re-ed: Swiss ball press down 3 way (fwd and lateral) x12 each, for improved core activation, cues for breath control, posture, and core activation Suitcase carry 10# 3x1 lap each UE for improved unilateral core activation/stability, cues for posture and breath control Standing shoulder ext  BTB 3x10 for improved core/periscapular control, cues for upright posture and breath control Hinge at counter x12, for improved posterior chain activation and core stability. Cues for hinge mechanics and appropriate ROM      PATIENT EDUCATION:  Education details: rationale for interventions, PT POC Person educated: Patient Education method: Explanation, Demonstration, and Handouts Education comprehension: verbalized understanding, returned demonstration, and needs further education     HOME EXERCISE PROGRAM: Access Code: [email or web address] URL: [email or web address] Date: 12/23/2021 Prepared by: Corlis Leak  Exercises -  Seated Figure 4 Piriformis Stretch  - 1 x daily - 7 x weekly - 2 sets - 30 sec hold - Seated Piriformis Stretch  - 1 x daily - 7 x weekly - 2 sets - 30 sec hold - Seated Hamstring Stretch  - 1 x daily - 7 x weekly - 2 sets - 30 sec  hold - Seated Slump Nerve Glide  - 1 x daily - 7 x weekly - 1 sets - 10 reps - Supine Piriformis Stretch with Leg Straight  - 2 x daily - 7 x weekly - 1 sets - 3 reps - 30 sec  hold - Hooklying Hamstring Stretch with Strap  - 2 x daily - 7 x weekly - 1 sets - 3 reps - 30 sec  hold - Straight Leg Raise Nerve Flossing  - 2 x daily - 7 x weekly - 1 sets - 10 reps - 2-3 sec  hold - Supine Transversus Abdominis Bracing with Pelvic Floor Contraction  - 2 x daily - 7 x weekly - 1 sets - 10 reps - 10sec  hold - Sit to Stand  - 2 x daily - 7 x weekly - 1 sets - 10 reps - 3-5 sec  hold - Anti-Rotation Lateral Stepping with Press  - 2 x daily - 7 x weekly - 1-2 sets - 10 reps - 2-3 sec  hold   - trigger point DN info ASSESSMENT:   CLINICAL IMPRESSION: Continued gradual improvement with decreased apin and radicular symptoms. He has continued aching in the LB and posterior hip Rt > Lt. Palpable tightness in the lumbar paraspinals, QL; piriformis; glut med Rt > Lt. Discussed possible trial of DN if tightness persists. Added stretching and strengthening exercises without difficulty. Progressing well toward stated goals of therapy.    OBJECTIVE IMPAIRMENTS: Abnormal gait, decreased activity tolerance, decreased mobility, difficulty walking, decreased strength, hypomobility, increased fascial restrictions, increased muscle spasms, impaired flexibility, improper body mechanics, postural dysfunction, and pain.    ACTIVITY LIMITATIONS: sitting, standing, transfers, and locomotion level   PARTICIPATION LIMITATIONS: community activity, occupation, and yard work   PERSONAL FACTORS: Age, Profession, and Time since onset of injury/illness/exacerbation are also affecting patient's functional outcome.    REHAB POTENTIAL: Good   CLINICAL DECISION MAKING: Stable/uncomplicated   EVALUATION COMPLEXITY: Low     GOALS: Goals reviewed with patient? Yes     LONG TERM GOALS: Target date: 01/19/2022   Pt will be ind  with initial HEP Baseline:  Goal status: INITIAL   2.  Pt will have (-) slump test to demo improved neural tension Baseline:  Goal status: INITIAL   3.  Pt will report >/=50% improvement of pain with all activities Baseline:  Goal status: INITIAL   4.  Pt will report >/=50% resolution of N/T with all activities Baseline:  Goal status: INITIAL   5.  FOTO score will increase to >/=68 Baseline:  Goal status: INITIAL     PLAN: PT FREQUENCY: 1x/week   PT DURATION: 6 weeks   PLANNED INTERVENTIONS: Therapeutic exercises, Therapeutic activity, Neuromuscular re-education, Balance training, Gait training, Patient/Family education, Self Care, Joint mobilization, Stair training, DME instructions, Aquatic Therapy, Dry Needling, Electrical stimulation, Spinal mobilization, Cryotherapy, Moist heat, Taping, Vasopneumatic device, Traction, Ionotophoresis 4mg /ml Dexamethasone, Manual therapy, and Re-evaluation.   PLAN FOR NEXT SESSION: Progress core stability and lumbar/hip mobility exercises as able/appropriate.     PT, DPT 12/23/2021 7:59 AM

## 2021-12-23 NOTE — Patient Instructions (Signed)

## 2021-12-29 ENCOUNTER — Encounter: Payer: Self-pay | Admitting: Rehabilitative and Restorative Service Providers"

## 2021-12-29 ENCOUNTER — Ambulatory Visit: Payer: BC Managed Care – PPO | Admitting: Rehabilitative and Restorative Service Providers"

## 2021-12-29 DIAGNOSIS — M5441 Lumbago with sciatica, right side: Secondary | ICD-10-CM

## 2021-12-29 DIAGNOSIS — R293 Abnormal posture: Secondary | ICD-10-CM

## 2021-12-29 DIAGNOSIS — M5459 Other low back pain: Secondary | ICD-10-CM

## 2021-12-29 DIAGNOSIS — R2689 Other abnormalities of gait and mobility: Secondary | ICD-10-CM

## 2021-12-29 NOTE — Therapy (Signed)
OUTPATIENT PHYSICAL THERAPY TREATMENT NOTE   Patient Name: Johnathan Oneal MRN: [id number] DOB:1975-07-26, 46 y.o., male Today's Date: 12/29/2021  PCP: Everrett Coombe REFERRING PROVIDER: Everrett Coombe  END OF SESSION:   PT End of Session - 12/29/21 0806     Visit Number 4    Number of Visits 6    Date for PT Re-Evaluation 01/19/22    Authorization Type BCBS    PT Start Time 0800    PT Stop Time 0845    PT Time Calculation (min) 45 min             Past Medical History:  Diagnosis Date   High cholesterol    Past Surgical History:  Procedure Laterality Date   ARTHROSCOPIC REPAIR ACL     Bilateral Patella Area   SHOULDER ARTHROSCOPY W/ LABRAL REPAIR     Patient Active Problem List   Diagnosis Date Noted   DDD (degenerative disc disease), lumbosacral 11/24/2021   Well adult exam 04/14/2021   Symptoms consistent with irritable bowel syndrome 12/13/2017   Chronic pain of both knees 12/13/2017   Chronic bilateral low back pain without sciatica 12/13/2017    REFERRING DIAG: M51.37 (ICD-10-CM) - DDD (degenerative disc disease), lumbosacral  THERAPY DIAG:  Bilateral low back pain with right-sided sciatica, unspecified chronicity  Other low back pain  Abnormal posture  Other abnormalities of gait and mobility  Rationale for Evaluation and Treatment Rehabilitation  PERTINENT HISTORY: n/a  PRECAUTIONS: none  SUBJECTIVE:                                                                                                                                                                                      SUBJECTIVE STATEMENT:   Patient reports that he had some increased pain in lower Rt LB Saturday into Sunday which he thinks is due to playing video games in a poor posture and position. Today he has some increased tightness in the Lt > Rt LB area. Exercises are going well. Frustrated with slow progress with strengthening. l  PAIN:  Are you having pain? Yes: NPRS  scale: 2-3/10 Pain location: low back, mild RLE numbness Pain description: throbbing, N?T Aggravating factors: Walking, squatting, mostly feels N/T while driving Relieving factors: pain medication, ice   OBJECTIVE: (objective measures completed at initial evaluation unless otherwise dated)   DIAGNOSTIC FINDINGS:  X-ray demos DDD   PATIENT SURVEYS:  FOTO 48; predicted 4   SCREENING FOR RED FLAGS: Bowel or bladder incontinence: No Spinal tumors: No Cauda equina syndrome: No Compression fracture: No Abdominal aneurysm: No   COGNITION:           Overall cognitive status: Within  functional limits for tasks assessed                          SENSATION: Reports N/T in back of R hip, can go all the way to front of thigh; N/T in left at times can be sharp to top of knee   MUSCLE LENGTH: Hamstrings: Right 60 deg; Left 60 deg Thomas test: Right 10 deg; Left 10 deg   POSTURE: L iliac crest slightly higher than R   PALPATION: TTP bilat glute med (R>L); spring testing WFL; TTP L>R PSIS   LUMBAR ROM:    AROM eval  Flexion 80%  Extension 80% feels it in spine  Right lateral flexion 100%  Left lateral flexion 100%  Right rotation 100%  Left rotation 100%   (Blank rows = not tested)     LOWER EXTREMITY MMT:     MMT Right eval Left eval  Hip flexion 4 5  Hip extension 5 5  Hip abduction 4+ 4+  Hip adduction      Hip internal rotation      Hip external rotation      Knee flexion 4 4  Knee extension 5 5  Ankle dorsiflexion      Ankle plantarflexion      Ankle inversion      Ankle eversion       (Blank rows = not tested)   LUMBAR SPECIAL TESTS:  Straight leg raise test: Positive, FABER test: Negative, and Long sit test: NegativeSlump test Positive bilat     GAIT: Distance walked: 100' Assistive device utilized: None Level of assistance: Complete Independence Comments: Mild R trunk lean during R stance phase, increased L weightshift  TODAY'S TREATMENT: 12/29/21:   Treadmill 2.3 mph x 5.5 min  Piriformis stretch supine travell 30 sec x 3  Supine hamstring stretch 30 sec x 3  Nerve stretch supine x 10 each LE  Transverse abdominals 10 sec x 10  Sit to stand core engaged hip hinge 10  Antirotation standing blue TB x 10 ea side  Modified deadlift 15# KB from 8 in stool core tight x 10   Education re-sitting use of use of pillows for sleeping positions; standing core engaged, importance of core strengthening/stabilization    Trigger Point Dry-Needling  Treatment instructions: Expect mild to moderate muscle soreness. S/S of pneumothorax if dry needled over a lung field, and to seek immediate medical attention should they occur. Patient verbalized understanding of these instructions and education.  Patient Consent Given: Yes Education handout provided: Previously provided Muscles treated: glut med/max; QL; lumbar paraspinals Electrical stimulation performed: Yes Parameters: mAmp current x 5 min to pt tolerance Treatment response/outcome: decreased palpable tightness      TODAY'S TREATMENT: 12/23/21:  Treadmill 2.2 mph x 5 min  Piriformis stretch supine travell 30 sec x 3  Supine hamstring stretch 30 sec x 3  Nerve stretch supine x 10 each LE  Transverse abdominals 10 sec x 10  Sit to stand core engaged hip hinge 10  Antirotation standing green TB x 10 ea side   Education re-sitting use of noodle in sitting; standing core engaged, importance of core strengthening/stabilization    [facility] Adult PT Treatment:                                DATE: 12/15/21 Therapeutic Exercise: Seated figure 4 piriformis 2x30sec B  Seated piriformis stretch  2x30sec B Hamstring stretch seated 2x30sec B (second set with strap), cues for setup Sciatic nerve glides 2x10 B LE, cues for form and pacing Hooklying LTR x10 B, cues for ROM and pacing  Neuromuscular re-ed: Swiss ball press down 3 way (fwd and lateral) x12 each, for improved core activation, cues for breath  control, posture, and core activation Suitcase carry 10# 3x1 lap each UE for improved unilateral core activation/stability, cues for posture and breath control Standing shoulder ext  BTB 3x10 for improved core/periscapular control, cues for upright posture and breath control Hinge at counter x12, for improved posterior chain activation and core stability. Cues for hinge mechanics and appropriate ROM      PATIENT EDUCATION:  Education details: rationale for interventions, PT POC Person educated: Patient Education method: Explanation, Demonstration, and Handouts Education comprehension: verbalized understanding, returned demonstration, and needs further education     HOME EXERCISE PROGRAM:    - triggeAccess Code: [email or web address] URL: [email or web address] Date: 12/29/2021 Prepared by: Corlis Leak  Exercises - Seated Figure 4 Piriformis Stretch  - 1 x daily - 7 x weekly - 2 sets - 30 sec hold - Seated Piriformis Stretch  - 1 x daily - 7 x weekly - 2 sets - 30 sec hold - Seated Hamstring Stretch  - 1 x daily - 7 x weekly - 2 sets - 30 sec hold - Seated Slump Nerve Glide  - 1 x daily - 7 x weekly - 1 sets - 10 reps - Supine Piriformis Stretch with Leg Straight  - 2 x daily - 7 x weekly - 1 sets - 3 reps - 30 sec  hold - Hooklying Hamstring Stretch with Strap  - 2 x daily - 7 x weekly - 1 sets - 3 reps - 30 sec  hold - Straight Leg Raise Nerve Flossing  - 2 x daily - 7 x weekly - 1 sets - 10 reps - 2-3 sec  hold - Supine Transversus Abdominis Bracing with Pelvic Floor Contraction  - 2 x daily - 7 x weekly - 1 sets - 10 reps - 10sec  hold - Sit to Stand  - 2 x daily - 7 x weekly - 1 sets - 10 reps - 3-5 sec  hold - Anti-Rotation Lateral Stepping with Press  - 2 x daily - 7 x weekly - 1-2 sets - 10 reps - 2-3 sec  hold - Modified Deadlift with Pelvic Contraction  - 1 x daily - 7 x weekly - 1 sets - 10 repsr point DN info ASSESSMENT:   CLINICAL IMPRESSION: Continued intermittent  flare ups of LBP Lt and Rt at times. Patient demonstrates and verbalizes gradual improvement with decreased apin and radicular symptoms. He has continued aching in the LB and posterior hip Lt > Rt today. Palpable tightness in the lumbar paraspinals, QL; piriformis; glut med Lt  > Rt. Trial of DN tolerated well. Continued stretching and strengthening exercises without difficulty. Progressing well toward stated goals of therapy.    OBJECTIVE IMPAIRMENTS: Abnormal gait, decreased activity tolerance, decreased mobility, difficulty walking, decreased strength, hypomobility, increased fascial restrictions, increased muscle spasms, impaired flexibility, improper body mechanics, postural dysfunction, and pain.    ACTIVITY LIMITATIONS: sitting, standing, transfers, and locomotion level   PARTICIPATION LIMITATIONS: community activity, occupation, and yard work   PERSONAL FACTORS: Age, Profession, and Time since onset of injury/illness/exacerbation are also affecting patient's functional outcome.    REHAB POTENTIAL: Good   CLINICAL DECISION MAKING: Stable/uncomplicated   EVALUATION COMPLEXITY: Low  GOALS: Goals reviewed with patient? Yes     LONG TERM GOALS: Target date: 01/19/2022   Pt will be ind with initial HEP Baseline:  Goal status: INITIAL   2.  Pt will have (-) slump test to demo improved neural tension Baseline:  Goal status: INITIAL   3.  Pt will report >/=50% improvement of pain with all activities Baseline:  Goal status: INITIAL   4.  Pt will report >/=50% resolution of N/T with all activities Baseline:  Goal status: INITIAL   5.  FOTO score will increase to >/=68 Baseline:  Goal status: INITIAL     PLAN: PT FREQUENCY: 1x/week   PT DURATION: 6 weeks   PLANNED INTERVENTIONS: Therapeutic exercises, Therapeutic activity, Neuromuscular re-education, Balance training, Gait training, Patient/Family education, Self Care, Joint mobilization, Stair training, DME  instructions, Aquatic Therapy, Dry Needling, Electrical stimulation, Spinal mobilization, Cryotherapy, Moist heat, Taping, Vasopneumatic device, Traction, Ionotophoresis 4mg /ml Dexamethasone, Manual therapy, and Re-evaluation.   PLAN FOR NEXT SESSION: Progress core stability and lumbar/hip mobility exercises as able/appropriate.     Khalaya Mcgurn P. PT, MPH 12/29/21 8:44 AM

## 2022-01-04 ENCOUNTER — Ambulatory Visit: Payer: BC Managed Care – PPO | Admitting: Physical Therapy

## 2022-01-04 ENCOUNTER — Encounter: Payer: Self-pay | Admitting: Physical Therapy

## 2022-01-04 DIAGNOSIS — M5459 Other low back pain: Secondary | ICD-10-CM | POA: Diagnosis not present

## 2022-01-04 DIAGNOSIS — R293 Abnormal posture: Secondary | ICD-10-CM

## 2022-01-04 DIAGNOSIS — M5441 Lumbago with sciatica, right side: Secondary | ICD-10-CM | POA: Diagnosis not present

## 2022-01-04 DIAGNOSIS — R2689 Other abnormalities of gait and mobility: Secondary | ICD-10-CM | POA: Diagnosis not present

## 2022-01-04 NOTE — Therapy (Signed)
OUTPATIENT PHYSICAL THERAPY TREATMENT NOTE   Patient Name: Johnathan MustyBrian Malhotra MRN: [id number] DOB:06/18/1975, 46 y.o., male Today's Date: 01/04/2022  PCP: Everrett CoombeMatthews, [place] REFERRING PROVIDER: Everrett CoombeMatthews, [place]  END OF SESSION:   PT End of Session - 01/04/22 0716     Visit Number 5    Number of Visits 6    Date for PT Re-Evaluation 01/19/22    Authorization Type BCBS    PT Start Time 0716    PT Stop Time 0800    PT Time Calculation (min) 44 min    Activity Tolerance Patient tolerated treatment well    Behavior During Therapy WFL for tasks assessed/performed             Past Medical History:  Diagnosis Date   High cholesterol    Past Surgical History:  Procedure Laterality Date   ARTHROSCOPIC REPAIR ACL     Bilateral Patella Area   SHOULDER ARTHROSCOPY W/ LABRAL REPAIR     Patient Active Problem List   Diagnosis Date Noted   DDD (degenerative disc disease), lumbosacral 11/24/2021   Well adult exam 04/14/2021   Symptoms consistent with irritable bowel syndrome 12/13/2017   Chronic pain of both knees 12/13/2017   Chronic bilateral low back pain without sciatica 12/13/2017    REFERRING DIAG: M51.37 (ICD-10-CM) - DDD (degenerative disc disease), lumbosacral  THERAPY DIAG:  Bilateral low back pain with right-sided sciatica, unspecified chronicity  Other low back pain  Abnormal posture  Other abnormalities of gait and mobility  Rationale for Evaluation and Treatment Rehabilitation  PERTINENT HISTORY: n/a  PRECAUTIONS: none  SUBJECTIVE:                                                                                                                                                                                      SUBJECTIVE STATEMENT:   Pt reports he had a good response to TPDN -- no soreness. Pt states pain has been feeling pretty good. "Feels achy" but for the most part has been feeling better. Mostly feeling it while in sitting and during squatting/lifting.    PAIN:  Are you having pain? Yes: NPRS scale: 1/10 Pain location: low back, mild RLE numbness Pain description: "just an ache" Aggravating factors: Walking, squatting, mostly feels N/T while driving Relieving factors: pain medication, ice   OBJECTIVE: (objective measures completed at initial evaluation unless otherwise dated)    PATIENT SURVEYS:  FOTO 48; predicted 6168                     SENSATION: Reports N/T in back of R hip, can go all the way to front of thigh; N/T in left at  times can be sharp to top of knee   MUSCLE LENGTH: Hamstrings: Right 60 deg; Left 60 deg Thomas test: Right 10 deg; Left 10 deg   POSTURE: L iliac crest slightly higher than R   PALPATION: TTP bilat glute med (R>L); spring testing WFL; TTP L>R PSIS   LUMBAR ROM:    AROM eval  Flexion 80%  Extension 80% feels it in spine  Right lateral flexion 100%  Left lateral flexion 100%  Right rotation 100%  Left rotation 100%   (Blank rows = not tested)     LOWER EXTREMITY MMT:     MMT Right eval Left eval  Hip flexion 4 5  Hip extension 5 5  Hip abduction 4+ 4+  Hip adduction      Hip internal rotation      Hip external rotation      Knee flexion 4 4  Knee extension 5 5  Ankle dorsiflexion      Ankle plantarflexion      Ankle inversion      Ankle eversion       (Blank rows = not tested)   LUMBAR SPECIAL TESTS:  Straight leg raise test: Positive, FABER test: Negative, and Long sit test: NegativeSlump test Positive bilat     GAIT: Distance walked: 100' Assistive device utilized: None Level of assistance: Complete Independence Comments: Mild R trunk lean during R stance phase, increased L weightshift  TODAY'S TREATMENT:  01/04/22: Treadmill 2.4 mph x 5 min  Sitting Figure 4 stretch 2x 30 sec bilat Piriformis stretch x 30 sec bilat Hamstring stretch x 30 sec bilat Sit<>stand 10# 2x10, no weight x10  Sitting on pball Posterior pelvic tilt 2x10 Posterior pelvic tilt with  marching 2x10 Posterior pelvic tilt with LAQ 2x10 Diagonal chops yellow weighted ball 2x10  Standing Deadlift 15# KB 2x10   12/29/21:  Treadmill 2.3 mph x 5.5 min  Piriformis stretch supine travell 30 sec x 3  Supine hamstring stretch 30 sec x 3  Nerve stretch supine x 10 each LE  Transverse abdominals 10 sec x 10  Sit to stand core engaged hip hinge 10  Antirotation standing blue TB x 10 ea side  Modified deadlift 15# KB from 8 in stool core tight x 10   Education re-sitting use of use of pillows for sleeping positions; standing core engaged, importance of core strengthening/stabilization    Trigger Point Dry-Needling  Treatment instructions: Expect mild to moderate muscle soreness. S/S of pneumothorax if dry needled over a lung field, and to seek immediate medical attention should they occur. Patient verbalized understanding of these instructions and education.  Patient Consent Given: Yes Education handout provided: Previously provided Muscles treated: glut med/max; QL; lumbar paraspinals Electrical stimulation performed: Yes Parameters: mAmp current x 5 min to pt tolerance Treatment response/outcome: decreased palpable tightness      TODAY'S TREATMENT: 12/23/21:  Treadmill 2.2 mph x 5 min  Piriformis stretch supine travell 30 sec x 3  Supine hamstring stretch 30 sec x 3  Nerve stretch supine x 10 each LE  Transverse abdominals 10 sec x 10  Sit to stand core engaged hip hinge 10  Antirotation standing green TB x 10 ea side   Education re-sitting use of noodle in sitting; standing core engaged, importance of core strengthening/stabilization      PATIENT EDUCATION:  Education details: rationale for interventions, PT POC Person educated: Patient Education method: Explanation, Demonstration, and Handouts Education comprehension: verbalized understanding, returned demonstration, and needs further education  HOME EXERCISE PROGRAM:  Access Code: [email or web address] URL:  [email or web address] Date: 12/29/2021 Prepared by: Corlis Leak  Exercises - Seated Figure 4 Piriformis Stretch  - 1 x daily - 7 x weekly - 2 sets - 30 sec hold - Seated Piriformis Stretch  - 1 x daily - 7 x weekly - 2 sets - 30 sec hold - Seated Hamstring Stretch  - 1 x daily - 7 x weekly - 2 sets - 30 sec hold - Seated Slump Nerve Glide  - 1 x daily - 7 x weekly - 1 sets - 10 reps - Supine Piriformis Stretch with Leg Straight  - 2 x daily - 7 x weekly - 1 sets - 3 reps - 30 sec  hold - Hooklying Hamstring Stretch with Strap  - 2 x daily - 7 x weekly - 1 sets - 3 reps - 30 sec  hold - Straight Leg Raise Nerve Flossing  - 2 x daily - 7 x weekly - 1 sets - 10 reps - 2-3 sec  hold - Supine Transversus Abdominis Bracing with Pelvic Floor Contraction  - 2 x daily - 7 x weekly - 1 sets - 10 reps - 10sec  hold - Sit to Stand  - 2 x daily - 7 x weekly - 1 sets - 10 reps - 3-5 sec  hold - Anti-Rotation Lateral Stepping with Press  - 2 x daily - 7 x weekly - 1-2 sets - 10 reps - 2-3 sec  hold - Modified Deadlift with Pelvic Contraction  - 1 x daily - 7 x weekly - 1 sets - 10 repsr point DN info  ASSESSMENT:   CLINICAL IMPRESSION: Pt notes that most of pain is during lifting tasks and sitting now. Initiated core strengthening in seated to work on this. Radicular symptoms no longer going down his leg. Continued to progress pt's strengthening and lifting with focus on core activation.     OBJECTIVE IMPAIRMENTS: Abnormal gait, decreased activity tolerance, decreased mobility, difficulty walking, decreased strength, hypomobility, increased fascial restrictions, increased muscle spasms, impaired flexibility, improper body mechanics, postural dysfunction, and pain.    ACTIVITY LIMITATIONS: sitting, standing, transfers, and locomotion level   PARTICIPATION LIMITATIONS: community activity, occupation, and yard work   PERSONAL FACTORS: Age, Profession, and Time since onset of  injury/illness/exacerbation are also affecting patient's functional outcome.    REHAB POTENTIAL: Good   CLINICAL DECISION MAKING: Stable/uncomplicated   EVALUATION COMPLEXITY: Low     GOALS: Goals reviewed with patient? Yes     LONG TERM GOALS: Target date: 01/19/2022   Pt will be ind with initial HEP Baseline:  Goal status: INITIAL   2.  Pt will have (-) slump test to demo improved neural tension Baseline:  Goal status: INITIAL   3.  Pt will report >/=50% improvement of pain with all activities Baseline:  Goal status: INITIAL   4.  Pt will report >/=50% resolution of N/T with all activities Baseline:  Goal status: INITIAL   5.  FOTO score will increase to >/=68 Baseline:  Goal status: INITIAL     PLAN: PT FREQUENCY: 1x/week   PT DURATION: 6 weeks   PLANNED INTERVENTIONS: Therapeutic exercises, Therapeutic activity, Neuromuscular re-education, Balance training, Gait training, Patient/Family education, Self Care, Joint mobilization, Stair training, DME instructions, Aquatic Therapy, Dry Needling, Electrical stimulation, Spinal mobilization, Cryotherapy, Moist heat, Taping, Vasopneumatic device, Traction, Ionotophoresis 4mg /ml Dexamethasone, Manual therapy, and Re-evaluation.   PLAN FOR NEXT SESSION: Check LTGs - discuss d/c or re-cert. Progress  core stability and lumbar/hip mobility exercises as able/appropriate.     Merick Kelleher April Ma L Ellen Goris, PT, DPT 01/04/22 7:16 AM

## 2022-01-12 ENCOUNTER — Encounter: Payer: BC Managed Care – PPO | Admitting: Rehabilitative and Restorative Service Providers"

## 2022-01-19 ENCOUNTER — Ambulatory Visit: Payer: BC Managed Care – PPO | Admitting: Physical Therapy

## 2022-01-19 DIAGNOSIS — R2689 Other abnormalities of gait and mobility: Secondary | ICD-10-CM

## 2022-01-19 DIAGNOSIS — M5459 Other low back pain: Secondary | ICD-10-CM | POA: Diagnosis not present

## 2022-01-19 DIAGNOSIS — R293 Abnormal posture: Secondary | ICD-10-CM | POA: Diagnosis not present

## 2022-01-19 DIAGNOSIS — M5441 Lumbago with sciatica, right side: Secondary | ICD-10-CM

## 2022-01-19 NOTE — Therapy (Signed)
OUTPATIENT PHYSICAL THERAPY TREATMENT NOTE   Patient Name: Johnathan Oneal MRN: [id number] DOB:10-14-1975, 46 y.o., male Today's Date: 01/19/2022  PCP: Luetta Nutting REFERRING PROVIDER: Luetta Nutting  END OF SESSION:   PT End of Session - 01/19/22 0800     Visit Number 6    Number of Visits 8    Date for PT Re-Evaluation 02/16/22    Authorization Type BCBS    PT Start Time 0800    PT Stop Time 0840    PT Time Calculation (min) 40 min    Activity Tolerance Patient tolerated treatment well    Behavior During Therapy WFL for tasks assessed/performed             Past Medical History:  Diagnosis Date   High cholesterol    Past Surgical History:  Procedure Laterality Date   ARTHROSCOPIC REPAIR ACL     Bilateral Patella Area   SHOULDER ARTHROSCOPY W/ LABRAL REPAIR     Patient Active Problem List   Diagnosis Date Noted   DDD (degenerative disc disease), lumbosacral 11/24/2021   Well adult exam 04/14/2021   Symptoms consistent with irritable bowel syndrome 12/13/2017   Chronic pain of both knees 12/13/2017   Chronic bilateral low back pain without sciatica 12/13/2017    REFERRING DIAG: M51.37 (ICD-10-CM) - DDD (degenerative disc disease), lumbosacral  THERAPY DIAG:  Bilateral low back pain with right-sided sciatica, unspecified chronicity  Other low back pain  Abnormal posture  Other abnormalities of gait and mobility  Rationale for Evaluation and Treatment Rehabilitation  PERTINENT HISTORY: n/a  PRECAUTIONS: none  SUBJECTIVE:                                                                                                                                                                                      SUBJECTIVE STATEMENT:   Pt reports he's been doing pretty well. Pt states his back has been fairly good. "Gotten to the point where it is occasional/rare that it's painful." Pt notes that the pain has moved around now when it does hurt. Pt states he has  changed positions at work so he does not drive as much as he used to.   PAIN:  Are you having pain? Yes: NPRS scale: 1/10 Pain location: low back, mild RLE numbness Pain description: "just an ache" Aggravating factors: Walking, squatting, mostly feels N/T while driving Relieving factors: pain medication, ice   OBJECTIVE: (objective measures completed at initial evaluation unless otherwise dated)    PATIENT SURVEYS:  FOTO 48 (eval); predicted 68          FOTO 63 (11/28)     MUSCLE LENGTH: Hamstrings: Right 60 deg;  Left 60 deg Thomas test: Right 10 deg; Left 10 deg     LUMBAR ROM:    AROM 11/28  Flexion 100%  Extension 100%  Right lateral flexion 100%  Left lateral flexion 100%  Right rotation 100%  Left rotation 100%   (Blank rows = not tested)     LOWER EXTREMITY MMT:     MMT Right eval Left eval Right 11/28 Left 11/28  Hip flexion 4 5 4+ 5  Hip extension _0 Hip abduction 4+ 4+ 5 5  Hip adduction        Hip internal rotation        Hip external rotation        Knee flexion _1 Knee extension _2 Ankle dorsiflexion        Ankle plantarflexion        Ankle inversion        Ankle eversion         (Blank rows = not tested)   LUMBAR SPECIAL TESTS 11/28:  Straight leg raise test: Negative, FABER test: Negative, and Long sit test: Negative, Slump test "just tight"    TODAY'S TREATMENT:  01/19/22: Treadmill 2.4 mph x 5 min  Sitting Figure 4 stretch 2x30 sec bilat Piriformis stretch 2x30 sec bilat Hip flexion green TB marching x10  Supine DL knee bend to ext with core contraction 2x10 90/90 alternating heel touch 2x10 Hip flexion green TB x10  Standing Hip flexion green TB 2x10     01/04/22: Treadmill 2.4 mph x 5 min  Sitting Figure 4 stretch 2x 30 sec bilat Piriformis stretch x 30 sec bilat Hamstring stretch x 30 sec bilat Sit<>stand 10# 2x10, no weight x10  Sitting on pball Posterior pelvic tilt 2x10 Posterior pelvic  tilt with marching 2x10 Posterior pelvic tilt with LAQ 2x10 Diagonal chops yellow weighted ball 2x10  Standing Deadlift 15# KB 2x10   12/29/21:  Treadmill 2.3 mph x 5.5 min  Piriformis stretch supine travell 30 sec x 3  Supine hamstring stretch 30 sec x 3  Nerve stretch supine x 10 each LE  Transverse abdominals 10 sec x 10  Sit to stand core engaged hip hinge 10  Antirotation standing blue TB x 10 ea side  Modified deadlift 15# KB from 8 in stool core tight x 10   Education re-sitting use of use of pillows for sleeping positions; standing core engaged, importance of core strengthening/stabilization    Trigger Point Dry-Needling  Treatment instructions: Expect mild to moderate muscle soreness. S/S of pneumothorax if dry needled over a lung field, and to seek immediate medical attention should they occur. Patient verbalized understanding of these instructions and education.  Patient Consent Given: Yes Education handout provided: Previously provided Muscles treated: glut med/max; QL; lumbar paraspinals Electrical stimulation performed: Yes Parameters: mAmp current x 5 min to pt tolerance Treatment response/outcome: decreased palpable tightness         PATIENT EDUCATION:  Education details: rationale for interventions, PT POC Person educated: Patient Education method: Explanation, Demonstration, and Handouts Education comprehension: verbalized understanding, returned demonstration, and needs further education     HOME EXERCISE PROGRAM:  Access Code: [email or web address] URL: [email or web address] Date: 01/19/2022 Prepared by: Estill Bamberg April Thurnell Garbe  Exercises - Seated Figure 4 Piriformis Stretch  - 1 x daily - 7 x weekly - 2 sets - 30 sec hold - Seated Piriformis Stretch  - 1 x daily - 7 x weekly -  2 sets - 30 sec hold - Seated Hamstring Stretch  - 1 x daily - 7 x weekly - 2 sets - 30 sec hold - Seated Slump Nerve Glide  - 1 x daily - 7 x weekly - 1 sets - 10  reps - Supine Piriformis Stretch with Leg Straight  - 2 x daily - 7 x weekly - 1 sets - 3 reps - 30 sec  hold - Hooklying Hamstring Stretch with Strap  - 2 x daily - 7 x weekly - 1 sets - 3 reps - 30 sec  hold - Straight Leg Raise Nerve Flossing  - 2 x daily - 7 x weekly - 1 sets - 10 reps - 2-3 sec  hold - Supine Transversus Abdominis Bracing with Pelvic Floor Contraction  - 2 x daily - 7 x weekly - 1 sets - 10 reps - 10sec  hold - Sit to Stand  - 2 x daily - 7 x weekly - 2 sets - 10 reps - 3-5 sec  hold - Anti-Rotation Lateral Stepping with Press  - 2 x daily - 7 x weekly - 1-2 sets - 10 reps - 2-3 sec  hold - Modified Deadlift with Pelvic Contraction  - 1 x daily - 7 x weekly - 2 sets - 10 reps - Seated Diagonal Lift with Medicine Ball  - 1 x daily - 7 x weekly - 2 sets - 10 reps - Supine Hip Flexion with Resistance  - 1 x daily - 7 x weekly - 2 sets - 10 reps - Seated March with Resistance  - 1 x daily - 7 x weekly - 2 sets - 10 reps - Marching with Resistance  - 1 x daily - 7 x weekly - 2 sets - 10 reps - Supine 90/90 Alternating Toe Touch  - 1 x daily - 7 x weekly - 2 sets - 10 reps - Supine Bent Leg Lift with Knee Extension  - 1 x daily - 7 x weekly - 2 sets - 10 reps  ASSESSMENT:   CLINICAL IMPRESSION: Session focused on rechecking pt's LTGs. Pt has been meeting his LTGs. Pt's strength has improved. Greatest deficit is that pt's R hip flexors remain slightly weaker than L. Session focused on progressing pt's core and hip flexor strengthening. Discussed d/c plans -- pt would like to do one more PT follow up in 2 weeks to check status. Pt continues to have very mild aching pain he hopes to resolve. Pt would benefit from additional PT to meet his goals.     OBJECTIVE IMPAIRMENTS: Abnormal gait, decreased activity tolerance, decreased mobility, difficulty walking, decreased strength, hypomobility, increased fascial restrictions, increased muscle spasms, impaired flexibility, improper body  mechanics, postural dysfunction, and pain.    ACTIVITY LIMITATIONS: sitting, standing, transfers, and locomotion level   PARTICIPATION LIMITATIONS: community activity, occupation, and yard work   PERSONAL FACTORS: Age, Profession, and Time since onset of injury/illness/exacerbation are also affecting patient's functional outcome.    REHAB POTENTIAL: Good   CLINICAL DECISION MAKING: Stable/uncomplicated   EVALUATION COMPLEXITY: Low     GOALS: Goals reviewed with patient? Yes     LONG TERM GOALS: Old target date: 01/19/2022; REVISED target date: 02/16/2022    Pt will be ind with initial HEP Baseline:  Goal status: ONGOING   2.  Pt will have (-) slump test to demo improved neural tension Baseline: "just tight" 11/28 Goal status: PARTIALLY MET   3.  Pt will report >/=50% improvement  of pain with all activities Baseline: at least 95% 11/28 Goal status: MET   4.  Pt will report >/=50% resolution of N/T with all activities Baseline: at least 90-95% resolution 11/28 Goal status: MET   5.  FOTO score will increase to >/=68  Baseline: 63 on 11/28 Goal status: ONGOING     PLAN: PT FREQUENCY: 1x/week   PT DURATION: 6 weeks   PLANNED INTERVENTIONS: Therapeutic exercises, Therapeutic activity, Neuromuscular re-education, Balance training, Gait training, Patient/Family education, Self Care, Joint mobilization, Stair training, DME instructions, Aquatic Therapy, Dry Needling, Electrical stimulation, Spinal mobilization, Cryotherapy, Moist heat, Taping, Vasopneumatic device, Traction, Ionotophoresis 55m/ml Dexamethasone, Manual therapy, and Re-evaluation.   PLAN FOR NEXT SESSION: Ready for D/C. Progress core stability and lumbar/hip mobility exercises as able/appropriate.     Lurlean Kernen April MGordy Levan PT, DPT 01/19/22 8:46 AM

## 2022-01-26 ENCOUNTER — Encounter: Payer: BC Managed Care – PPO | Admitting: Physical Therapy

## 2022-01-27 ENCOUNTER — Encounter: Payer: BC Managed Care – PPO | Admitting: Rehabilitative and Restorative Service Providers"

## 2022-02-02 ENCOUNTER — Encounter: Payer: BC Managed Care – PPO | Admitting: Physical Therapy

## 2022-02-04 ENCOUNTER — Ambulatory Visit: Payer: BC Managed Care – PPO | Attending: Family Medicine | Admitting: Physical Therapy

## 2022-02-04 ENCOUNTER — Encounter: Payer: Self-pay | Admitting: Physical Therapy

## 2022-02-04 DIAGNOSIS — R2689 Other abnormalities of gait and mobility: Secondary | ICD-10-CM | POA: Insufficient documentation

## 2022-02-04 DIAGNOSIS — M5441 Lumbago with sciatica, right side: Secondary | ICD-10-CM | POA: Insufficient documentation

## 2022-02-04 DIAGNOSIS — M5459 Other low back pain: Secondary | ICD-10-CM | POA: Insufficient documentation

## 2022-02-04 DIAGNOSIS — R293 Abnormal posture: Secondary | ICD-10-CM | POA: Insufficient documentation

## 2022-02-04 NOTE — Therapy (Addendum)
OUTPATIENT PHYSICAL THERAPY TREATMENT NOTE AND DISCHARGE   Patient Name: Johnathan Oneal MRN: [id number] DOB:Apr 29, 1975, 46 y.o., male Today's Date: 02/04/2022  PCP: Luetta Nutting REFERRING PROVIDER: Luetta Nutting  PHYSICAL THERAPY DISCHARGE SUMMARY  Visits from Start of Care: 7  Current functional level related to goals / functional outcomes: Pt has met all of his strength goals. No longer having pain with daily activities.    Remaining deficits: FOTO score not fully met. Pt notes feeling some of his back pain with more difficult tasks such as lifting and running.    Education / Equipment: Progression of exercises.   Patient agrees to discharge. Patient goals were partially met. Patient is being discharged due to meeting the stated rehab goals.   END OF SESSION:   PT End of Session - 02/04/22 0807     Visit Number 7    Number of Visits 8    Date for PT Re-Evaluation 02/16/22    Authorization Type BCBS    PT Start Time 0807    PT Stop Time 0845    PT Time Calculation (min) 38 min    Activity Tolerance Patient tolerated treatment well    Behavior During Therapy WFL for tasks assessed/performed             Past Medical History:  Diagnosis Date   High cholesterol    Past Surgical History:  Procedure Laterality Date   ARTHROSCOPIC REPAIR ACL     Bilateral Patella Area   SHOULDER ARTHROSCOPY W/ LABRAL REPAIR     Patient Active Problem List   Diagnosis Date Noted   DDD (degenerative disc disease), lumbosacral 11/24/2021   Well adult exam 04/14/2021   Symptoms consistent with irritable bowel syndrome 12/13/2017   Chronic pain of both knees 12/13/2017   Chronic bilateral low back pain without sciatica 12/13/2017    REFERRING DIAG: M51.37 (ICD-10-CM) - DDD (degenerative disc disease), lumbosacral  THERAPY DIAG:  Bilateral low back pain with right-sided sciatica, unspecified chronicity  Other low back pain  Abnormal posture  Other abnormalities of  gait and mobility  Rationale for Evaluation and Treatment Rehabilitation  PERTINENT HISTORY: n/a  PRECAUTIONS: none  SUBJECTIVE:                                                                                                                                                                                      SUBJECTIVE STATEMENT:   Pt states no problems with everyday stuff. No longer taking tylenol. Pt states that beyond every day stuff (I.e. lifting heavier loads he can feel it as well as jumping). States he can kind of feel it with running.   PAIN:  Are you having pain? Yes: NPRS scale: 0/10 Pain location: low back, mild RLE numbness Pain description: "just an ache" Aggravating factors: Running, lifting heavier Relieving factors: pain medication, ice   OBJECTIVE: (objective measures completed at initial evaluation unless otherwise dated)    PATIENT SURVEYS:  FOTO 48 (eval); predicted 68          FOTO 63 (11/28)     MUSCLE LENGTH: Hamstrings: Right 60 deg; Left 60 deg Thomas test: Right 10 deg; Left 10 deg     LUMBAR ROM:    AROM 11/28  Flexion 100%  Extension 100%  Right lateral flexion 100%  Left lateral flexion 100%  Right rotation 100%  Left rotation 100%   (Blank rows = not tested)     LOWER EXTREMITY MMT:     MMT Right eval Left eval Right 11/28 Left 11/28  Hip flexion 4 5 4+ 5  Hip extension _0 Hip abduction 4+ 4+ 5 5  Hip adduction        Hip internal rotation        Hip external rotation        Knee flexion _1 Knee extension _2 Ankle dorsiflexion        Ankle plantarflexion        Ankle inversion        Ankle eversion         (Blank rows = not tested)   LUMBAR SPECIAL TESTS 11/28:  Straight leg raise test: Negative, FABER test: Negative, and Long sit test: Negative, Slump test "just tight"    TODAY'S TREATMENT:  02/04/22: THEREX Treadmill 2.4 mph x 5 min Squat at pulleys 50# x 10 SL sit<>stand 10# KB 3x8 SL  deadlift 10# KB 3x8 SL heel raise 3x10    SELF CARE: See pt education   01/19/22: Treadmill 2.4 mph x 5 min  Sitting Figure 4 stretch 2x30 sec bilat Piriformis stretch 2x30 sec bilat Hip flexion green TB marching x10  Supine DL knee bend to ext with core contraction 2x10 90/90 alternating heel touch 2x10 Hip flexion green TB x10  Standing Hip flexion green TB 2x10        PATIENT EDUCATION:  Education details: PT POC, how to progress exercises, how to [employer] exercises Person educated: Patient Education method: Explanation, Demonstration, and Handouts Education comprehension: verbalized understanding, returned demonstration, and needs further education     HOME EXERCISE PROGRAM:  Access Code: [email or web address] URL: [email or web address] Date: 01/19/2022 Prepared by: Estill Bamberg April Thurnell Garbe  Exercises - Seated Figure 4 Piriformis Stretch  - 1 x daily - 7 x weekly - 2 sets - 30 sec hold - Seated Piriformis Stretch  - 1 x daily - 7 x weekly - 2 sets - 30 sec hold - Seated Hamstring Stretch  - 1 x daily - 7 x weekly - 2 sets - 30 sec hold - Seated Slump Nerve Glide  - 1 x daily - 7 x weekly - 1 sets - 10 reps - Supine Piriformis Stretch with Leg Straight  - 2 x daily - 7 x weekly - 1 sets - 3 reps - 30 sec  hold - Hooklying Hamstring Stretch with Strap  - 2 x daily - 7 x weekly - 1 sets - 3 reps - 30 sec  hold - Straight Leg Raise Nerve Flossing  - 2 x daily - 7 x weekly - 1 sets - 10 reps - 2-3  sec  hold - Supine Transversus Abdominis Bracing with Pelvic Floor Contraction  - 2 x daily - 7 x weekly - 1 sets - 10 reps - 10sec  hold - Sit to Stand  - 2 x daily - 7 x weekly - 2 sets - 10 reps - 3-5 sec  hold - Anti-Rotation Lateral Stepping with Press  - 2 x daily - 7 x weekly - 1-2 sets - 10 reps - 2-3 sec  hold - Modified Deadlift with Pelvic Contraction  - 1 x daily - 7 x weekly - 2 sets - 10 reps - Seated Diagonal Lift with Medicine Ball  - 1 x daily - 7 x  weekly - 2 sets - 10 reps - Supine Hip Flexion with Resistance  - 1 x daily - 7 x weekly - 2 sets - 10 reps - Seated March with Resistance  - 1 x daily - 7 x weekly - 2 sets - 10 reps - Marching with Resistance  - 1 x daily - 7 x weekly - 2 sets - 10 reps - Supine 90/90 Alternating Toe Touch  - 1 x daily - 7 x weekly - 2 sets - 10 reps - Supine Bent Leg Lift with Knee Extension  - 1 x daily - 7 x weekly - 2 sets - 10 reps  ASSESSMENT:   CLINICAL IMPRESSION: Treatment focused on finalizing pt's D/C. Discussed HEP -- how to mix/match and how to continue to progress at home. Pt is ready for d/c. Exercises now mostly focus on single leg power for jumping and running activities. Pt has met or partially met all of his LTGs. Pt knows to call office if any questions or issues.     OBJECTIVE IMPAIRMENTS: Abnormal gait, decreased activity tolerance, decreased mobility, difficulty walking, decreased strength, hypomobility, increased fascial restrictions, increased muscle spasms, impaired flexibility, improper body mechanics, postural dysfunction, and pain.    ACTIVITY LIMITATIONS: sitting, standing, transfers, and locomotion level   PARTICIPATION LIMITATIONS: community activity, occupation, and yard work   PERSONAL FACTORS: Age, Profession, and Time since onset of injury/illness/exacerbation are also affecting patient's functional outcome.    REHAB POTENTIAL: Good   CLINICAL DECISION MAKING: Stable/uncomplicated   EVALUATION COMPLEXITY: Low     GOALS: Goals reviewed with patient? Yes     LONG TERM GOALS: Old target date: 01/19/2022; REVISED target date: 02/16/2022    Pt will be ind with initial HEP Baseline:  Goal status: MET   2.  Pt will have (-) slump test to demo improved neural tension Baseline: "just tight" 11/28 Goal status: MET   3.  Pt will report >/=50% improvement of pain with all activities Baseline: at least 95% 11/28 Goal status: MET   4.  Pt will report >/=50%  resolution of N/T with all activities Baseline: at least 90-95% resolution 11/28 Goal status: MET   5.  FOTO score will increase to >/=68  Baseline: 63 on 11/28 Goal status: PARTIALLY MET     PLAN: PT FREQUENCY: 1x/week   PT DURATION: 6 weeks   PLANNED INTERVENTIONS: Therapeutic exercises, Therapeutic activity, Neuromuscular re-education, Balance training, Gait training, Patient/Family education, Self Care, Joint mobilization, Stair training, DME instructions, Aquatic Therapy, Dry Needling, Electrical stimulation, Spinal mobilization, Cryotherapy, Moist heat, Taping, Vasopneumatic device, Traction, Ionotophoresis 37m/ml Dexamethasone, Manual therapy, and Re-evaluation.   PLAN FOR NEXT SESSION: Pt to call if any issues.     Delainie Chavana April MGordy Levan PT, DPT 02/04/22 8:07 AM

## 2022-05-15 ENCOUNTER — Other Ambulatory Visit: Payer: Self-pay | Admitting: Family Medicine

## 2022-11-16 ENCOUNTER — Encounter: Payer: Self-pay | Admitting: Internal Medicine

## 2022-11-27 ENCOUNTER — Other Ambulatory Visit: Payer: Self-pay | Admitting: Family Medicine

## 2022-11-30 ENCOUNTER — Ambulatory Visit (AMBULATORY_SURGERY_CENTER): Payer: BC Managed Care – PPO

## 2022-11-30 VITALS — Ht 73.0 in | Wt 195.0 lb

## 2022-11-30 DIAGNOSIS — Z1211 Encounter for screening for malignant neoplasm of colon: Secondary | ICD-10-CM

## 2022-11-30 MED ORDER — NA SULFATE-K SULFATE-MG SULF 17.5-3.13-1.6 GM/177ML PO SOLN: 1.0000 | Freq: Once | ORAL | 0 refills | Status: AC

## 2022-11-30 NOTE — Progress Notes (Signed)

## 2022-11-30 NOTE — Telephone Encounter (Signed)
Patient scheduled for 12/14/22, thanks.

## 2022-12-10 ENCOUNTER — Encounter: Payer: Self-pay | Admitting: Internal Medicine

## 2022-12-14 ENCOUNTER — Ambulatory Visit: Payer: BC Managed Care – PPO | Admitting: Family Medicine

## 2022-12-14 ENCOUNTER — Encounter: Payer: Self-pay | Admitting: Family Medicine

## 2022-12-14 VITALS — BP 135/87 | HR 68 | Ht 73.0 in | Wt 197.0 lb

## 2022-12-14 DIAGNOSIS — Z1159 Encounter for screening for other viral diseases: Secondary | ICD-10-CM

## 2022-12-14 DIAGNOSIS — L821 Other seborrheic keratosis: Secondary | ICD-10-CM | POA: Diagnosis not present

## 2022-12-14 DIAGNOSIS — Z114 Encounter for screening for human immunodeficiency virus [HIV]: Secondary | ICD-10-CM | POA: Diagnosis not present

## 2022-12-14 DIAGNOSIS — E785 Hyperlipidemia, unspecified: Secondary | ICD-10-CM | POA: Insufficient documentation

## 2022-12-14 DIAGNOSIS — G4730 Sleep apnea, unspecified: Secondary | ICD-10-CM | POA: Insufficient documentation

## 2022-12-14 NOTE — Assessment & Plan Note (Signed)
Doing well with pravastatin.  Update lipid panel today.

## 2022-12-14 NOTE — Progress Notes (Signed)
Johnathan Oneal - 47 y.o. male MRN [id number]  Date of birth: 07-07-1975  Subjective Chief Complaint  Patient presents with   Hyperlipidemia    HPI Johnathan Oneal is a 47 y.o. male here today for follow up visit.   He reports that he is doing pretty well.  He has history of HLD that is managed with pravastatin.  He has been tolerating this well.  He is due for labs.    Concerns about skin lesion on R shoulder blade area.  Has had for several years.  Does not bother him.    ROS:  A comprehensive ROS was completed and negative except as noted per HPI  Allergies  Allergen Reactions   Penicillins     As per pt - had a reaction as a child. Does not recall what type of reaction to med.     Past Medical History:  Diagnosis Date   Allergy    Arthritis    High cholesterol     Past Surgical History:  Procedure Laterality Date   ARTHROSCOPIC REPAIR ACL     Bilateral Patella Area   SHOULDER ARTHROSCOPY W/ LABRAL REPAIR      Social History   Socioeconomic History   Marital status: Married    Spouse name: Not on file   Number of children: Not on file   Years of education: Not on file   Highest education level: Bachelor's degree (e.g., BA, AB, BS)  Occupational History   Occupation: Air cabin crew: [employer]  Tobacco Use   Smoking status: Never   Smokeless tobacco: Never  Vaping Use   Vaping status: Never Used  Substance and Sexual Activity   Alcohol use: Yes   Drug use: Never   Sexual activity: Yes    Partners: Female    Birth control/protection: Pill  Other Topics Concern   Not on file  Social History Narrative   Not on file   Social Determinants of Health   Financial Resource Strain: Low Risk  (12/10/2022)   Overall Financial Resource Strain (CARDIA)    Difficulty of Paying Living Expenses: Not very hard  Food Insecurity: No Food Insecurity (12/10/2022)   Hunger Vital Sign    Worried About Running Out of Food in the Last Year: Never  true    Ran Out of Food in the Last Year: Never true  Transportation Needs: No Transportation Needs (12/10/2022)   PRAPARE - Administrator, Civil Service (Medical): No    Lack of Transportation (Non-Medical): No  Physical Activity: Insufficiently Active (12/10/2022)   Exercise Vital Sign    Days of Exercise per Week: 1 day    Minutes of Exercise per Session: 20 min  Stress: No Stress Concern Present (12/10/2022)   [employer] of Occupational Health - Occupational Stress Questionnaire    Feeling of Stress : Only a little  Social Connections: Moderately Isolated (12/10/2022)   Social Connection and Isolation Panel [NHANES]    Frequency of Communication with Friends and Family: Once a week    Frequency of Social Gatherings with Friends and Family: Once a week    Attends Religious Services: 1 to 4 times per year    Active Member of [employer] or Organizations: No    Attends Engineer, structural: Not on file    Marital Status: Married    Family History  Problem Relation Age of Onset   Breast cancer Mother    High blood pressure Father  Diabetes Father    Heart attack Maternal Grandfather    Canavan disease Paternal Grandmother    Cancer Paternal Grandmother    Diabetes Paternal Grandfather    Colon cancer Neg Hx    Colon polyps Neg Hx    Esophageal cancer Neg Hx    Rectal cancer Neg Hx    Stomach cancer Neg Hx     Health Maintenance  Topic Date Due   HIV Screening  Never done   Hepatitis C Screening  Never done   Colonoscopy  Never done   DTaP/Tdap/Td (3 - Td or Tdap) 04/15/2031   INFLUENZA VACCINE  Completed   COVID-19 Vaccine  Completed   HPV VACCINES  Aged Out     ----------------------------------------------------------------------------------------------------------------------------------------------------------------------------------------------------------------- Physical Exam BP 135/87   Pulse 68   Ht 6\' 1"  (1.854 m)   Wt 197 lb  (89.4 kg)   SpO2 100%   BMI 25.99 kg/m   Physical Exam Constitutional:      Appearance: Normal appearance.  Eyes:     General: No scleral icterus. Cardiovascular:     Rate and Rhythm: Normal rate and regular rhythm.  Pulmonary:     Effort: Pulmonary effort is normal.     Breath sounds: Normal breath sounds.  Skin:    Comments: SK located on L shoulder blade area.   Neurological:     Mental Status: He is alert.     ------------------------------------------------------------------------------------------------------------------------------------------------------------------------------------------------------------------- Assessment and Plan  SK (seborrheic keratosis) Discussed benign appearance of SK.    Hyperlipidemia Doing well with pravastatin.  Update lipid panel today.    No orders of the defined types were placed in this encounter.   Return in about 1 year (around 12/14/2023) for Annual Exam/HLD.    This visit occurred during the SARS-CoV-2 public health emergency.  Safety protocols were in place, including screening questions prior to the visit, additional usage of staff PPE, and extensive cleaning of exam room while observing appropriate contact time as indicated for disinfecting solutions.

## 2022-12-14 NOTE — Assessment & Plan Note (Signed)
Discussed benign appearance of SK.

## 2022-12-15 LAB — CMP14+EGFR
ALT: 30 [IU]/L (ref 0–44)
AST: 21 [IU]/L (ref 0–40)
Albumin: 4.6 g/dL (ref 4.1–5.1)
Alkaline Phosphatase: 107 [IU]/L (ref 44–121)
BUN/Creatinine Ratio: 19 (ref 9–20)
BUN: 16 mg/dL (ref 6–24)
Bilirubin Total: 0.3 mg/dL (ref 0.0–1.2)
CO2: 24 mmol/L (ref 20–29)
Calcium: 9.4 mg/dL (ref 8.7–10.2)
Chloride: 106 mmol/L (ref 96–106)
Creatinine, Ser: 0.86 mg/dL (ref 0.76–1.27)
Globulin, Total: 2.7 g/dL (ref 1.5–4.5)
Glucose: 87 mg/dL (ref 70–99)
Potassium: 4.5 mmol/L (ref 3.5–5.2)
Sodium: 144 mmol/L (ref 134–144)
Total Protein: 7.3 g/dL (ref 6.0–8.5)
eGFR: 109 mL/min/{1.73_m2} (ref >59)

## 2022-12-15 LAB — CBC WITH DIFFERENTIAL/PLATELET
Basophils Absolute: 0 10*3/uL (ref 0.0–0.2)
Basos: 1 %
EOS (ABSOLUTE): 0.3 10*3/uL (ref 0.0–0.4)
Eos: 6 %
Hematocrit: 47.9 % (ref 37.5–51.0)
Hemoglobin: 15.2 g/dL (ref 13.0–17.7)
Immature Grans (Abs): 0 10*3/uL (ref 0.0–0.1)
Immature Granulocytes: 0 %
Lymphocytes Absolute: 1.7 10*3/uL (ref 0.7–3.1)
Lymphs: 31 %
MCH: 29.9 pg (ref 26.6–33.0)
MCHC: 31.7 g/dL (ref 31.5–35.7)
MCV: 94 fL (ref 79–97)
Monocytes Absolute: 0.5 10*3/uL (ref 0.1–0.9)
Monocytes: 9 %
Neutrophils Absolute: 2.9 10*3/uL (ref 1.4–7.0)
Neutrophils: 53 %
Platelets: 252 10*3/uL (ref 150–450)
RBC: 5.08 x10E6/uL (ref 4.14–5.80)
RDW: 13.1 % (ref 11.6–15.4)
WBC: 5.4 10*3/uL (ref 3.4–10.8)

## 2022-12-15 LAB — LIPID PANEL WITH LDL/HDL RATIO
Cholesterol, Total: 230 mg/dL — ABNORMAL HIGH (ref 100–199)
HDL: 34 mg/dL — ABNORMAL LOW (ref >39)
LDL Chol Calc (NIH): 169 mg/dL — ABNORMAL HIGH (ref 0–99)
LDL/HDL Ratio: 5 ratio — ABNORMAL HIGH (ref 0.0–3.6)
Triglycerides: 148 mg/dL (ref 0–149)
VLDL Cholesterol Cal: 27 mg/dL (ref 5–40)

## 2022-12-15 LAB — HEPATITIS C ANTIBODY: Hep C Virus Ab: NONREACTIVE

## 2022-12-15 LAB — HIV ANTIBODY (ROUTINE TESTING W REFLEX): HIV Screen 4th Generation wRfx: NONREACTIVE

## 2022-12-21 NOTE — Progress Notes (Unsigned)
[facility] History and Physical   Primary Care Physician:  Everrett Coombe, DO   Reason for Procedure:   CRCA screening  Plan:    colonoscopy     HPI: Johnathan Oneal is a 47 y.o. male presenting for colon cancer screening by colonoscopy.   Past Medical History:  Diagnosis Date   Allergy    Arthritis    High cholesterol     Past Surgical History:  Procedure Laterality Date   ARTHROSCOPIC REPAIR ACL     Bilateral Patella Area   SHOULDER ARTHROSCOPY W/ LABRAL REPAIR      Prior to Admission medications   Medication Sig Start Date End Date Taking? Authorizing Provider  pravastatin (PRAVACHOL) 40 MG tablet TAKE 1 TABLET(40 MG) BY MOUTH DAILY 12/01/22   Everrett Coombe, DO    Current Outpatient Medications  Medication Sig Dispense Refill   pravastatin (PRAVACHOL) 40 MG tablet TAKE 1 TABLET(40 MG) BY MOUTH DAILY 90 tablet 3   No current facility-administered medications for this visit.    Allergies as of 12/22/2022 - Review Complete 12/14/2022  Allergen Reaction Noted   Penicillins  12/13/2017    Family History  Problem Relation Age of Onset   Breast cancer Mother    High blood pressure Father    Diabetes Father    Heart attack Maternal Grandfather    Canavan disease Paternal Grandmother    Cancer Paternal Grandmother    Diabetes Paternal Grandfather    Colon cancer Neg Hx    Colon polyps Neg Hx    Esophageal cancer Neg Hx    Rectal cancer Neg Hx    Stomach cancer Neg Hx     Social History   Socioeconomic History   Marital status: Married    Spouse name: Not on file   Number of children: Not on file   Years of education: Not on file   Highest education level: Bachelor's degree (e.g., BA, AB, BS)  Occupational History   Occupation: Air cabin crew: [employer]  Tobacco Use   Smoking status: Never   Smokeless tobacco: Never  Vaping Use   Vaping status: Never Used  Substance and Sexual Activity   Alcohol use: Yes    Drug use: Never   Sexual activity: Yes    Partners: Female    Birth control/protection: Pill  Other Topics Concern   Not on file  Social History Narrative   Not on file   Social Determinants of Health   Financial Resource Strain: Low Risk  (12/10/2022)   Overall Financial Resource Strain (CARDIA)    Difficulty of Paying Living Expenses: Not very hard  Food Insecurity: No Food Insecurity (12/10/2022)   Hunger Vital Sign    Worried About Running Out of Food in the Last Year: Never true    Ran Out of Food in the Last Year: Never true  Transportation Needs: No Transportation Needs (12/10/2022)   PRAPARE - Administrator, Civil Service (Medical): No    Lack of Transportation (Non-Medical): No  Physical Activity: Insufficiently Active (12/10/2022)   Exercise Vital Sign    Days of Exercise per Week: 1 day    Minutes of Exercise per Session: 20 min  Stress: No Stress Concern Present (12/10/2022)   [employer] of Occupational Health - Occupational Stress Questionnaire    Feeling of Stress : Only a little  Social Connections: Moderately Isolated (12/10/2022)   Social Connection and Isolation Panel [NHANES]    Frequency of  Communication with Friends and Family: Once a week    Frequency of Social Gatherings with Friends and Family: Once a week    Attends Religious Services: 1 to 4 times per year    Active Member of [employer] or Organizations: No    Attends Engineer, structural: Not on file    Marital Status: Married  Catering manager Violence: Not on file    Review of Systems: Positive for *** All other review of systems negative except as mentioned in the HPI.  Physical Exam: Vital signs There were no vitals taken for this visit.  General:   Alert,  Well-developed, well-nourished, pleasant and cooperative in NAD Lungs:  Clear throughout to auscultation.   Heart:  Regular rate and rhythm; no murmurs, clicks, rubs,  or gallops. Abdomen:  Soft, nontender and  nondistended. Normal bowel sounds.   Neuro/Psych:  Alert and cooperative. Normal mood and affect. A and O x 3   @Claudia Greenley  Sena Slate, MD, [facility] [phone] (pager) 12/21/2022 7:12 PM@

## 2022-12-22 ENCOUNTER — Ambulatory Visit (AMBULATORY_SURGERY_CENTER): Payer: BC Managed Care – PPO | Admitting: Internal Medicine

## 2022-12-22 ENCOUNTER — Encounter: Payer: Self-pay | Admitting: Internal Medicine

## 2022-12-22 VITALS — BP 139/90 | HR 68 | Temp 97.3°F | Resp 15 | Ht 73.0 in | Wt 195.0 lb

## 2022-12-22 DIAGNOSIS — Z1211 Encounter for screening for malignant neoplasm of colon: Secondary | ICD-10-CM

## 2022-12-22 MED ORDER — SODIUM CHLORIDE 0.9 % IV SOLN: 500.0000 mL | Freq: Once | INTRAVENOUS | Status: DC

## 2022-12-22 NOTE — Patient Instructions (Addendum)
No polyps or cancer were seen.  You do have internal hemorrhoids. That can explain the blood when you have a hard stool.  Next routine colonoscopy or other screening test in 10 years - 2034.  I appreciate the opportunity to care for you. Iva Boop, MD, [facility]  Resume previous diet.  Continue present medications.  Repeat colonoscopy in 10 years for screening purposes.  Handout provided on hemorrhoids.   YOU HAD AN ENDOSCOPIC PROCEDURE TODAY AT THE [facility]:   Refer to the procedure report that was given to you for any specific questions about what was found during the examination.  If the procedure report does not answer your questions, please call your gastroenterologist to clarify.  If you requested that your care partner not be given the details of your procedure findings, then the procedure report has been included in a sealed envelope for you to review at your convenience later.  YOU SHOULD EXPECT: Some feelings of bloating in the abdomen. Passage of more gas than usual.  Walking can help get rid of the air that was put into your GI tract during the procedure and reduce the bloating. If you had a lower endoscopy (such as a colonoscopy or flexible sigmoidoscopy) you may notice spotting of blood in your stool or on the toilet paper. If you underwent a bowel prep for your procedure, you may not have a normal bowel movement for a few days.  Please Note:  You might notice some irritation and congestion in your nose or some drainage.  This is from the oxygen used during your procedure.  There is no need for concern and it should clear up in a day or so.  SYMPTOMS TO REPORT IMMEDIATELY:  Following lower endoscopy (colonoscopy or flexible sigmoidoscopy):  Excessive amounts of blood in the stool  Significant tenderness or worsening of abdominal pains  Swelling of the abdomen that is new, acute  Fever of 100F or higher  For urgent or emergent issues, a gastroenterologist  can be reached at any hour by calling [phone]. Do not use MyChart messaging for urgent concerns.    DIET:  We do recommend a small meal at first, but then you may proceed to your regular diet.  Drink plenty of fluids but you should avoid alcoholic beverages for 24 hours.  ACTIVITY:  You should plan to take it easy for the rest of today and you should NOT DRIVE or use heavy machinery until tomorrow (because of the sedation medicines used during the test).    FOLLOW UP: Our staff will call the number listed on your records the next business day following your procedure.  We will call around 7:15- 8:00 am to check on you and address any questions or concerns that you may have regarding the information given to you following your procedure. If we do not reach you, we will leave a message.     If any biopsies were taken you will be contacted by phone or by letter within the next 1-3 weeks.  Please call us at [phone] if you have not heard about the biopsies in 3 weeks.    SIGNATURES/CONFIDENTIALITY: You and/or your care partner have signed paperwork which will be entered into your electronic medical record.  These signatures attest to the fact that that the information above on your After Visit Summary has been reviewed and is understood.  Full responsibility of the confidentiality of this discharge information lies with you and/or your care-partner.

## 2022-12-22 NOTE — Progress Notes (Signed)
Sedate, gd SR, tolerated procedure well, VSS, report to RN 

## 2022-12-22 NOTE — Op Note (Signed)
[facility] Patient Name: Johnathan Oneal Procedure Date: 12/22/2022 10:51 AM MRN: [id number] Endoscopist: Iva Boop , MD, [id number] Age: 47 Referring MD:  Date of Birth: 1976-02-05 Gender: Male Account #: [id number] Procedure:                Colonoscopy Indications:              Screening for colorectal malignant neoplasm, This                            is the patient's first colonoscopy Medicines:                Monitored Anesthesia Care Procedure:                Pre-Anesthesia Assessment:                           - Prior to the procedure, a History and Physical                            was performed, and patient medications and                            allergies were reviewed. The patient's tolerance of                            previous anesthesia was also reviewed. The risks                            and benefits of the procedure and the sedation                            options and risks were discussed with the patient.                            All questions were answered, and informed consent                            was obtained. Prior Anticoagulants: The patient has                            taken no anticoagulant or antiplatelet agents. ASA                            Grade Assessment: II - A patient with mild systemic                            disease. After reviewing the risks and benefits,                            the patient was deemed in satisfactory condition to                            undergo the procedure.  After obtaining informed consent, the colonoscope                            was passed under direct vision. Throughout the                            procedure, the patient's blood pressure, pulse, and                            oxygen saturations were monitored continuously. The                            CF [id number] #[id number] was introduced through the anus                            and advanced to the  the cecum, identified by                            appendiceal orifice and ileocecal valve. The                            colonoscopy was performed without difficulty. The                            patient tolerated the procedure well. The quality                            of the bowel preparation was good. The ileocecal                            valve, appendiceal orifice, and rectum were                            photographed. The bowel preparation used was SUPREP                            via split dose instruction. Scope In: 11:01:24 AM Scope Out: 11:14:56 AM Scope Withdrawal Time: 0 hours 12 minutes 56 seconds  Total Procedure Duration: 0 hours 13 minutes 32 seconds  Findings:                 The perianal and digital rectal examinations were                            normal. Pertinent negatives include normal prostate                            (size, shape, and consistency).                           Internal hemorrhoids were found. The hemorrhoids                            were small.  The exam was otherwise without abnormality on                            direct and retroflexion views. Complications:            No immediate complications. Estimated Blood Loss:     Estimated blood loss: none. Impression:               - Internal hemorrhoids.                           - The examination was otherwise normal on direct                            and retroflexion views.                           - No specimens collected. Recommendation:           - Patient has a contact number available for                            emergencies. The signs and symptoms of potential                            delayed complications were discussed with the                            patient. Return to normal activities tomorrow.                            Written discharge instructions were provided to the                            patient.                           -  Resume previous diet.                           - Continue present medications.                           - Repeat colonoscopy in 10 years for screening                            purposes. Iva Boop, MD 12/22/2022 11:22:06 AM This report has been signed electronically.

## 2022-12-22 NOTE — Progress Notes (Signed)
Pt's states no medical or surgical changes since previsit or office visit. 

## 2022-12-23 ENCOUNTER — Telehealth: Payer: Self-pay | Admitting: *Deleted

## 2022-12-23 NOTE — Telephone Encounter (Signed)
  Follow up Call-     12/22/2022   10:01 AM  Call back number  Post procedure Call Back phone  # [phone]  Permission to leave phone message Yes     Patient questions:  Do you have a fever, pain , or abdominal swelling? No. Pain Score  0 *  Have you tolerated food without any problems? Yes.    Have you been able to return to your normal activities? Yes.    Do you have any questions about your discharge instructions: Diet   No. Medications  No. Follow up visit  No.  Do you have questions or concerns about your Care? No.  Actions: * If pain score is 4 or above: No action needed, pain <4.

## 2022-12-24 ENCOUNTER — Encounter: Payer: Self-pay | Admitting: Family Medicine

## 2022-12-27 MED ORDER — ROSUVASTATIN CALCIUM 20 MG PO TABS: 20.0000 mg | ORAL_TABLET | Freq: Every day | ORAL | 3 refills | Status: DC

## 2023-04-18 DIAGNOSIS — R079 Chest pain, unspecified: Secondary | ICD-10-CM | POA: Diagnosis not present

## 2023-04-18 DIAGNOSIS — R42 Dizziness and giddiness: Secondary | ICD-10-CM | POA: Diagnosis not present

## 2023-04-18 DIAGNOSIS — R0781 Pleurodynia: Secondary | ICD-10-CM | POA: Diagnosis not present

## 2023-04-18 LAB — LAB REPORT - SCANNED

## 2023-04-20 DIAGNOSIS — I498 Other specified cardiac arrhythmias: Secondary | ICD-10-CM | POA: Diagnosis not present

## 2023-05-11 ENCOUNTER — Encounter: Payer: Self-pay | Admitting: Family Medicine

## 2023-05-11 DIAGNOSIS — E785 Hyperlipidemia, unspecified: Secondary | ICD-10-CM

## 2023-05-11 NOTE — Telephone Encounter (Signed)
 Lab order pended for review/sign off.

## 2023-05-12 DIAGNOSIS — E785 Hyperlipidemia, unspecified: Secondary | ICD-10-CM | POA: Diagnosis not present

## 2023-05-13 LAB — HEPATIC FUNCTION PANEL
ALT: 48 IU/L — ABNORMAL HIGH (ref 0–44)
AST: 20 IU/L (ref 0–40)
Albumin: 4.5 g/dL (ref 4.1–5.1)
Alkaline Phosphatase: 113 IU/L (ref 44–121)
Bilirubin Total: 0.3 mg/dL (ref 0.0–1.2)
Bilirubin, Direct: 0.11 mg/dL (ref 0.00–0.40)
Total Protein: 7.1 g/dL (ref 6.0–8.5)

## 2023-05-13 LAB — LIPID PANEL WITH LDL/HDL RATIO
Cholesterol, Total: 144 mg/dL (ref 100–199)
HDL: 32 mg/dL — ABNORMAL LOW (ref >39)
LDL Chol Calc (NIH): 91 mg/dL (ref 0–99)
LDL/HDL Ratio: 2.8 ratio (ref 0.0–3.6)
Triglycerides: 112 mg/dL (ref 0–149)
VLDL Cholesterol Cal: 21 mg/dL (ref 5–40)

## 2023-05-20 ENCOUNTER — Encounter: Payer: Self-pay | Admitting: Family Medicine

## 2023-12-08 ENCOUNTER — Encounter: Payer: BC Managed Care – PPO | Admitting: Family Medicine

## 2023-12-13 ENCOUNTER — Ambulatory Visit (INDEPENDENT_AMBULATORY_CARE_PROVIDER_SITE_OTHER): Admitting: Family Medicine

## 2023-12-13 VITALS — BP 154/98 | HR 65 | Ht 73.0 in | Wt 204.0 lb

## 2023-12-13 DIAGNOSIS — Z0000 Encounter for general adult medical examination without abnormal findings: Secondary | ICD-10-CM | POA: Diagnosis not present

## 2023-12-13 DIAGNOSIS — Z23 Encounter for immunization: Secondary | ICD-10-CM | POA: Diagnosis not present

## 2023-12-13 DIAGNOSIS — M7660 Achilles tendinitis, unspecified leg: Secondary | ICD-10-CM | POA: Insufficient documentation

## 2023-12-13 DIAGNOSIS — G4730 Sleep apnea, unspecified: Secondary | ICD-10-CM

## 2023-12-13 DIAGNOSIS — E785 Hyperlipidemia, unspecified: Secondary | ICD-10-CM | POA: Diagnosis not present

## 2023-12-13 NOTE — Addendum Note (Signed)
 Addended by: Naethan Bracewell Z on: 12/13/2023 02:01 PM   Modules accepted: Orders

## 2023-12-13 NOTE — Patient Instructions (Signed)

## 2023-12-13 NOTE — Assessment & Plan Note (Signed)
 Well adult Orders Placed This Encounter  Procedures   CMP14+EGFR   CBC with Differential/Platelet   Lipid Panel With LDL/HDL Ratio  Screenings: Per lab orders Immunizations: COVID and flu vaccine Anticipatory guidance/risk factor reduction: Recommendations per AVS.

## 2023-12-13 NOTE — Progress Notes (Signed)
 Johnathan Oneal - 48 y.o. male MRN [id number]  Date of birth: [birth year]  Subjective Chief Complaint  Patient presents with   Annual Exam    HPI Johnathan Oneal is a 48 y.o. male here today for annual exam.   He reports that he is doing pretty well.   Having some pain along distal achilles for a few months on L foot.   Tolerating crestor  well at current strength.   He is moderately active.  He feels that diet is pretty good.   He is a non-smoker.  Rare EtOH.   Review of Systems  Constitutional:  Negative for chills, fever, malaise/fatigue and weight loss.  HENT:  Negative for congestion, ear pain and sore throat.   Eyes:  Negative for blurred vision, double vision and pain.  Respiratory:  Negative for cough and shortness of breath.   Cardiovascular:  Negative for chest pain and palpitations.  Gastrointestinal:  Negative for abdominal pain, blood in stool, constipation, heartburn and nausea.  Genitourinary:  Negative for dysuria and urgency.  Musculoskeletal:  Negative for joint pain and myalgias.  Neurological:  Negative for dizziness and headaches.  Endo/Heme/Allergies:  Does not bruise/bleed easily.  Psychiatric/Behavioral:  Negative for depression. The patient is not nervous/anxious and does not have insomnia.     Allergies  Allergen Reactions   Penicillins     As per pt - had a reaction as a child. Does not recall what type of reaction to med.     Past Medical History:  Diagnosis Date   Allergy    Arthritis    High cholesterol     Past Surgical History:  Procedure Laterality Date   ARTHROSCOPIC REPAIR ACL     Bilateral Patella Area   FRACTURE SURGERY     SHOULDER ARTHROSCOPY W/ LABRAL REPAIR      Social History   Socioeconomic History   Marital status: Married    Spouse name: Not on file   Number of children: Not on file   Years of education: Not on file   Highest education level: Bachelor's degree (e.g., BA, AB, BS)  Occupational History    Occupation: Air cabin crew: [employer]  Tobacco Use   Smoking status: Never   Smokeless tobacco: Never  Vaping Use   Vaping status: Never Used  Substance and Sexual Activity   Alcohol use: Yes    Comment: occ   Drug use: Never   Sexual activity: Not Currently    Partners: Female    Birth control/protection: Pill  Other Topics Concern   Not on file  Social History Narrative   Not on file   Social Drivers of Health   Financial Resource Strain: Low Risk  (12/13/2023)   Overall Financial Resource Strain (CARDIA)    Difficulty of Paying Living Expenses: Not hard at all  Food Insecurity: No Food Insecurity (12/13/2023)   Hunger Vital Sign    Worried About Running Out of Food in the Last Year: Never true    Ran Out of Food in the Last Year: Never true  Transportation Needs: No Transportation Needs (12/13/2023)   PRAPARE - Administrator, Civil Service (Medical): No    Lack of Transportation (Non-Medical): No  Physical Activity: Insufficiently Active (12/13/2023)   Exercise Vital Sign    Days of Exercise per Week: 2 days    Minutes of Exercise per Session: 20 min  Stress: No Stress Concern Present (12/13/2023)   [employer] of  Occupational Health - Occupational Stress Questionnaire    Feeling of Stress: Only a little  Social Connections: Socially Isolated (12/13/2023)   Social Connection and Isolation Panel    Frequency of Communication with Friends and Family: Once a week    Frequency of Social Gatherings with Friends and Family: Once a week    Attends Religious Services: Never    Database administrator or Organizations: No    Attends Engineer, structural: Not on file    Marital Status: Married    Family History  Problem Relation Age of Onset   Breast cancer Mother    Arthritis Mother    High blood pressure Father    Diabetes Father    Arthritis Father    Heart attack Maternal Grandfather    Stomach cancer Paternal  Grandmother    Canavan disease Paternal Grandmother    Cancer Paternal Grandmother    Diabetes Paternal Grandfather    Colon cancer Neg Hx    Colon polyps Neg Hx    Esophageal cancer Neg Hx    Rectal cancer Neg Hx     Health Maintenance  Topic Date Due   Influenza Vaccine  09/23/2023   COVID-19 Vaccine (5 - 2025-26 season) 10/24/2023   Hepatitis B Vaccines 19-59 Average Risk (1 of 3 - 19+ 3-dose series) 12/12/2024 (Originally 01/31/1996)   DTaP/Tdap/Td (3 - Td or Tdap) 04/15/2031   Colonoscopy  12/21/2032   Hepatitis C Screening  Completed   HIV Screening  Completed   Pneumococcal Vaccine  Aged Out   HPV VACCINES  Aged Out   Meningococcal B Vaccine  Aged Out     ----------------------------------------------------------------------------------------------------------------------------------------------------------------------------------------------------------------- Physical Exam BP (!) 148/96 (BP Location: Left Arm, Patient Position: Sitting, Cuff Size: Large)   Pulse 65   Ht 6' 1 (1.854 m)   Wt 204 lb (92.5 kg)   SpO2 96%   BMI 26.91 kg/m   Physical Exam Constitutional:      General: He is not in acute distress. HENT:     Head: Normocephalic and atraumatic.     Right Ear: Tympanic membrane and external ear normal.     Left Ear: Tympanic membrane and external ear normal.  Eyes:     General: No scleral icterus. Neck:     Thyroid: No thyromegaly.  Cardiovascular:     Rate and Rhythm: Normal rate and regular rhythm.     Heart sounds: Normal heart sounds.  Pulmonary:     Effort: Pulmonary effort is normal.     Breath sounds: Normal breath sounds.  Abdominal:     General: Bowel sounds are normal. There is no distension.     Palpations: Abdomen is soft.     Tenderness: There is no abdominal tenderness. There is no guarding.  Musculoskeletal:     Cervical back: Normal range of motion.  Lymphadenopathy:     Cervical: No cervical adenopathy.  Skin:    General:  Skin is warm and dry.     Findings: No rash.  Neurological:     Mental Status: He is alert and oriented to person, place, and time.     Cranial Nerves: No cranial nerve deficit.     Motor: No abnormal muscle tone.  Psychiatric:        Mood and Affect: Mood normal.        Behavior: Behavior normal.     ------------------------------------------------------------------------------------------------------------------------------------------------------------------------------------------------------------------- Assessment and Plan  Insertional Achilles tendinopathy Recommend Gel heelcups or small heel lift.  Handout for  home eccentric exercises given.   Well adult exam Well adult Orders Placed This Encounter  Procedures   CMP14+EGFR   CBC with Differential/Platelet   Lipid Panel With LDL/HDL Ratio  Screenings: Per lab orders Immunizations: COVID and flu vaccine Anticipatory guidance/risk factor reduction: Recommendations per AVS.   No orders of the defined types were placed in this encounter.   No follow-ups on file.

## 2023-12-13 NOTE — Assessment & Plan Note (Signed)
 Recommend Gel heelcups or small heel lift.  Handout for home eccentric exercises given.

## 2023-12-14 LAB — CMP14+EGFR
ALT: 73 IU/L — ABNORMAL HIGH (ref 0–44)
AST: 35 IU/L (ref 0–40)
Albumin: 4.8 g/dL (ref 4.1–5.1)
Alkaline Phosphatase: 112 IU/L (ref 47–123)
BUN/Creatinine Ratio: 17 (ref 9–20)
BUN: 15 mg/dL (ref 6–24)
Bilirubin Total: 0.5 mg/dL (ref 0.0–1.2)
CO2: 23 mmol/L (ref 20–29)
Calcium: 9.7 mg/dL (ref 8.7–10.2)
Chloride: 103 mmol/L (ref 96–106)
Creatinine, Ser: 0.87 mg/dL (ref 0.76–1.27)
Globulin, Total: 2.7 g/dL (ref 1.5–4.5)
Glucose: 84 mg/dL (ref 70–99)
Potassium: 4.3 mmol/L (ref 3.5–5.2)
Sodium: 140 mmol/L (ref 134–144)
Total Protein: 7.5 g/dL (ref 6.0–8.5)
eGFR: 108 mL/min/1.73 (ref >59)

## 2023-12-14 LAB — CBC WITH DIFFERENTIAL/PLATELET
Basophils Absolute: 0 x10E3/uL (ref 0.0–0.2)
Basos: 1 %
EOS (ABSOLUTE): 0.3 x10E3/uL (ref 0.0–0.4)
Eos: 4 %
Hematocrit: 47.7 % (ref 37.5–51.0)
Hemoglobin: 15.9 g/dL (ref 13.0–17.7)
Immature Grans (Abs): 0 x10E3/uL (ref 0.0–0.1)
Immature Granulocytes: 0 %
Lymphocytes Absolute: 2.3 x10E3/uL (ref 0.7–3.1)
Lymphs: 34 %
MCH: 30.3 pg (ref 26.6–33.0)
MCHC: 33.3 g/dL (ref 31.5–35.7)
MCV: 91 fL (ref 79–97)
Monocytes Absolute: 0.5 x10E3/uL (ref 0.1–0.9)
Monocytes: 8 %
Neutrophils Absolute: 3.6 x10E3/uL (ref 1.4–7.0)
Neutrophils: 53 %
Platelets: 263 x10E3/uL (ref 150–450)
RBC: 5.24 x10E6/uL (ref 4.14–5.80)
RDW: 12.5 % (ref 11.6–15.4)
WBC: 6.8 x10E3/uL (ref 3.4–10.8)

## 2023-12-14 LAB — LIPID PANEL WITH LDL/HDL RATIO
Cholesterol, Total: 149 mg/dL (ref 100–199)
HDL: 32 mg/dL — ABNORMAL LOW (ref >39)
LDL Chol Calc (NIH): 91 mg/dL (ref 0–99)
LDL/HDL Ratio: 2.8 ratio (ref 0.0–3.6)
Triglycerides: 144 mg/dL (ref 0–149)
VLDL Cholesterol Cal: 26 mg/dL (ref 5–40)

## 2023-12-23 ENCOUNTER — Ambulatory Visit: Payer: Self-pay | Admitting: Family Medicine

## 2023-12-27 ENCOUNTER — Ambulatory Visit (INDEPENDENT_AMBULATORY_CARE_PROVIDER_SITE_OTHER)

## 2023-12-27 VITALS — BP 133/87 | HR 74 | Ht 73.0 in

## 2023-12-27 DIAGNOSIS — R030 Elevated blood-pressure reading, without diagnosis of hypertension: Secondary | ICD-10-CM | POA: Diagnosis not present

## 2023-12-27 NOTE — Progress Notes (Signed)
   Established Patient Office Visit  Subjective   Patient ID: Jonpaul Lumm, male    DOB: 1975-05-02  Age: 48 y.o. MRN: [id number]  Chief Complaint  Patient presents with   elevated  blood pressure without diagnosis of hypertension    BP check nurse visit.     HPI  Elevated blood pressure reading without the diagnosis of hypertension. Bp check nurse visit.  Patient denies chest pain, shortness of breath, dizziness, headache, palpitations, vision changes or problems with medications.   ROS    Objective:     BP 133/87   Pulse 74   Ht 6' 1 (1.854 m)   SpO2 99%   BMI 26.91 kg/m    Physical Exam   No results found for any visits on 12/27/23.    The 10-year ASCVD risk score (Arnett DK, et al., 2019) is: 2.6%    Assessment & Plan:  BP check nurse  visit. Initial reading = 133/87 . Second reading = 127/86. Per Dr. Matthews:BP in acceptable range-return for yearly visit and as needed.  Problem List Items Addressed This Visit       Other   Elevated BP without diagnosis of hypertension - Primary    Return for return for yearly visit and as needed. SABRA Suzen SHAUNNA Alpheus, LPN

## 2023-12-27 NOTE — Patient Instructions (Signed)
 return for yearly visit and as needed.

## 2024-01-11 ENCOUNTER — Other Ambulatory Visit: Payer: Self-pay | Admitting: Family Medicine

## 2024-01-11 DIAGNOSIS — E785 Hyperlipidemia, unspecified: Secondary | ICD-10-CM
